# Patient Record
Sex: Female | Born: 1985 | Race: Black or African American | Hispanic: No | Marital: Single | Smoking: Current every day smoker
Health system: Southern US, Community
[De-identification: ages and names within clinical notes are randomized; demographics above are authoritative.]

## PROBLEM LIST (undated history)

## (undated) ENCOUNTER — Inpatient Hospital Stay (HOSPITAL_COMMUNITY): Payer: Self-pay

## (undated) DIAGNOSIS — Z046 Encounter for general psychiatric examination, requested by authority: Secondary | ICD-10-CM

## (undated) DIAGNOSIS — F209 Schizophrenia, unspecified: Secondary | ICD-10-CM

## (undated) DIAGNOSIS — Z789 Other specified health status: Secondary | ICD-10-CM

## (undated) DIAGNOSIS — Z8619 Personal history of other infectious and parasitic diseases: Secondary | ICD-10-CM

## (undated) HISTORY — DX: Encounter for general psychiatric examination, requested by authority: Z04.6

## (undated) HISTORY — DX: Personal history of other infectious and parasitic diseases: Z86.19

## (undated) HISTORY — PX: OTHER SURGICAL HISTORY: SHX169

---

## 2010-12-11 ENCOUNTER — Emergency Department: Payer: Self-pay | Admitting: Emergency Medicine

## 2010-12-16 ENCOUNTER — Encounter (HOSPITAL_COMMUNITY): Payer: Self-pay

## 2010-12-16 ENCOUNTER — Inpatient Hospital Stay (HOSPITAL_COMMUNITY)
Admission: AD | Admit: 2010-12-16 | Discharge: 2010-12-16 | Disposition: A | Discharge status: Home / Self Care | Payer: Medicaid Other | Source: Ambulatory Visit | Attending: Family Medicine | Admitting: Family Medicine

## 2010-12-16 DIAGNOSIS — A499 Bacterial infection, unspecified: Secondary | ICD-10-CM

## 2010-12-16 DIAGNOSIS — N760 Acute vaginitis: Secondary | ICD-10-CM | POA: Insufficient documentation

## 2010-12-16 DIAGNOSIS — B9689 Other specified bacterial agents as the cause of diseases classified elsewhere: Secondary | ICD-10-CM

## 2010-12-16 DIAGNOSIS — O2390 Unspecified genitourinary tract infection in pregnancy, unspecified trimester: Secondary | ICD-10-CM | POA: Insufficient documentation

## 2010-12-16 DIAGNOSIS — Z3201 Encounter for pregnancy test, result positive: Secondary | ICD-10-CM

## 2010-12-16 HISTORY — DX: Other specified health status: Z78.9

## 2010-12-16 LAB — URINALYSIS, ROUTINE W REFLEX MICROSCOPIC
Bilirubin Urine: NEGATIVE
Glucose, UA: NEGATIVE mg/dL
Hgb urine dipstick: NEGATIVE
Ketones, ur: NEGATIVE mg/dL
Leukocytes, UA: NEGATIVE
Nitrite: NEGATIVE
Protein, ur: NEGATIVE mg/dL
Specific Gravity, Urine: 1.025 (ref 1.005–1.030)
Urobilinogen, UA: 0.2 mg/dL (ref 0.0–1.0)
pH: 6 (ref 5.0–8.0)

## 2010-12-16 LAB — POCT PREGNANCY, URINE: Preg Test, Ur: POSITIVE

## 2010-12-16 LAB — WET PREP, GENITAL
Trich, Wet Prep: NONE SEEN
Yeast Wet Prep HPF POC: NONE SEEN

## 2010-12-16 MED ORDER — METRONIDAZOLE 500 MG PO TABS: 500.0000 mg | ORAL_TABLET | Freq: Two times a day (BID) | ORAL | Status: AC

## 2010-12-16 NOTE — Progress Notes (Signed)
Pt states LMP-10/28/2010, home upt+, has intermittent lower abd cramping, mild. Denies bleeding or vag d/c changes.
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
## 2010-12-16 NOTE — Progress Notes (Signed)
Patient states she has had positive pregnancy tests at home at the end of November. Has had some nausea, no vomiting and has cramping off and on but none now. Wants to know how far she is.

## 2010-12-17 LAB — GC/CHLAMYDIA PROBE AMP, GENITAL
Chlamydia, DNA Probe: NEGATIVE
GC Probe Amp, Genital: NEGATIVE

## 2010-12-18 ENCOUNTER — Emergency Department: Payer: Self-pay | Admitting: Internal Medicine

## 2011-01-20 ENCOUNTER — Inpatient Hospital Stay (HOSPITAL_COMMUNITY)
Admission: AD | Admit: 2011-01-20 | Discharge: 2011-01-20 | Disposition: A | Discharge status: Home / Self Care | Payer: Medicaid Other | Source: Ambulatory Visit | Attending: Obstetrics & Gynecology | Admitting: Obstetrics & Gynecology

## 2011-01-20 ENCOUNTER — Encounter (HOSPITAL_COMMUNITY): Payer: Self-pay | Admitting: *Deleted

## 2011-01-20 DIAGNOSIS — O219 Vomiting of pregnancy, unspecified: Secondary | ICD-10-CM

## 2011-01-20 DIAGNOSIS — O210 Mild hyperemesis gravidarum: Secondary | ICD-10-CM | POA: Insufficient documentation

## 2011-01-20 LAB — URINALYSIS, ROUTINE W REFLEX MICROSCOPIC
Bilirubin Urine: NEGATIVE
Glucose, UA: NEGATIVE mg/dL
Hgb urine dipstick: NEGATIVE
Ketones, ur: NEGATIVE mg/dL
Leukocytes, UA: NEGATIVE
Nitrite: NEGATIVE
Protein, ur: NEGATIVE mg/dL
Specific Gravity, Urine: 1.025 (ref 1.005–1.030)
Urobilinogen, UA: 0.2 mg/dL (ref 0.0–1.0)
pH: 6 (ref 5.0–8.0)

## 2011-01-20 MED ORDER — METOCLOPRAMIDE HCL 10 MG PO TABS: 10.0000 mg | ORAL_TABLET | Freq: Four times a day (QID) | ORAL | Status: AC

## 2011-01-20 NOTE — ED Provider Notes (Signed)
History     No chief complaint on file.  HPI 26 y.o. G1P0000 at [redacted]w[redacted]d. N/V/D since Sunday, vomiting stopped Monday, diarrhea decreased as well, still nauseous.    Past Medical History  Diagnosis Date  . No pertinent past medical history     Past Surgical History  Procedure Date  . Stitches to r eye     Family History  Problem Relation Age of Onset  . Anesthesia problems Neg Hx   . Hypotension Neg Hx   . Malignant hyperthermia Neg Hx   . Pseudochol deficiency Neg Hx     History  Substance Use Topics  . Smoking status: Current Everyday Smoker -- 0.2 packs/day    Types: Cigarettes  . Smokeless tobacco: Never Used  . Alcohol Use: No    Allergies: No Known Allergies  Prescriptions prior to admission  Medication Sig Dispense Refill  . acetaminophen (TYLENOL) 500 MG tablet Take 1,000 mg by mouth every 6 (six) hours as needed. Patient used this medication for a headache.         Review of Systems  Constitutional: Negative.   Respiratory: Negative.   Cardiovascular: Negative.   Gastrointestinal: Positive for nausea, vomiting and diarrhea. Negative for abdominal pain and constipation.  Genitourinary: Negative for dysuria, urgency, frequency, hematuria and flank pain.       Negative for vaginal bleeding, vaginal discharge, dyspareunia  Musculoskeletal: Negative.   Neurological: Negative.   Psychiatric/Behavioral: Negative.    Physical Exam   Blood pressure 100/55, pulse 74, temperature 99.2 F (37.3 C), temperature source Oral, resp. rate 20, height 5\' 1"  (1.549 m), weight 147 lb 4 oz (66.792 kg), last menstrual period 10/26/2010.  Physical Exam  Nursing note and vitals reviewed. Constitutional: She is oriented to person, place, and time. She appears well-developed and well-nourished. No distress.  Cardiovascular: Normal rate.   Respiratory: Effort normal.  GI: Soft. Bowel sounds are normal. She exhibits no distension and no mass. There is no tenderness. There is  no rebound and no guarding.  Musculoskeletal: Normal range of motion.  Neurological: She is alert and oriented to person, place, and time.  Skin: Skin is warm and dry.  Psychiatric: She has a normal mood and affect.    MAU Course  Procedures  Results for orders placed during the hospital encounter of 01/20/11 (from the past 24 hour(s))  URINALYSIS, ROUTINE W REFLEX MICROSCOPIC     Status: Normal   Collection Time   01/20/11  7:48 PM      Component Value Range   Color, Urine YELLOW  YELLOW    APPearance CLEAR  CLEAR    Specific Gravity, Urine 1.025  1.005 - 1.030    pH 6.0  5.0 - 8.0    Glucose, UA NEGATIVE  NEGATIVE (mg/dL)   Hgb urine dipstick NEGATIVE  NEGATIVE    Bilirubin Urine NEGATIVE  NEGATIVE    Ketones, ur NEGATIVE  NEGATIVE (mg/dL)   Protein, ur NEGATIVE  NEGATIVE (mg/dL)   Urobilinogen, UA 0.2  0.0 - 1.0 (mg/dL)   Nitrite NEGATIVE  NEGATIVE    Leukocytes, UA NEGATIVE  NEGATIVE      Assessment and Plan  26 y.o. G1P0000 at [redacted]w[redacted]d Pregnancy nausea vs. Gastroenteritis - rx reglan for nausea F/U PRN Start prenatal care ASAP  Stephanie Golden 01/20/2011, 8:40 PM

## 2011-01-20 NOTE — Progress Notes (Signed)
PT SAYS SHE WAS  HERE IN DEC-  GAVE EDC  08-03-2011- BASED ON CYCLE.  VOMITED STARTED Sunday NIGHT -  CONTINUED-  THRU  Monday- NONE  TODAY.   SAYS DIARRHEA STARTED Sunday NIGHT - - HAS CONTINUED  UNTIL NOW- WHEN SHE EATS- SHE GOES TO B-ROOM.-  RUNNY.   HAS BEEN  3X TODAY.      HAS NOT TAKEN  ANY MED. FOR ANYTHING.   NO COUGH,  SORETHROAT,  NO FEVER AT [facility],  NOONE ELSE SICK AT HOME.   SHE THINKS IT STARTED AFTER TAKING AN ADULT PNV INSTEAD OF   [facility] BRAND .

## 2011-01-24 NOTE — ED Provider Notes (Signed)
Agree with above note.  Stephanie Bozard H. 01/24/2011 5:55 PM

## 2011-02-22 DIAGNOSIS — O0930 Supervision of pregnancy with insufficient antenatal care, unspecified trimester: Secondary | ICD-10-CM | POA: Insufficient documentation

## 2011-02-22 DIAGNOSIS — Z8619 Personal history of other infectious and parasitic diseases: Secondary | ICD-10-CM

## 2011-02-22 HISTORY — DX: Personal history of other infectious and parasitic diseases: Z86.19

## 2011-03-01 LAB — OB RESULTS CONSOLE HEPATITIS B SURFACE ANTIGEN: Hepatitis B Surface Ag: NEGATIVE

## 2011-03-01 LAB — OB RESULTS CONSOLE HIV ANTIBODY (ROUTINE TESTING): HIV: NONREACTIVE

## 2011-03-22 ENCOUNTER — Other Ambulatory Visit (INDEPENDENT_AMBULATORY_CARE_PROVIDER_SITE_OTHER): Payer: Medicaid Other

## 2011-03-22 ENCOUNTER — Encounter (INDEPENDENT_AMBULATORY_CARE_PROVIDER_SITE_OTHER): Payer: Medicaid Other | Admitting: Registered Nurse

## 2011-03-22 DIAGNOSIS — Z3689 Encounter for other specified antenatal screening: Secondary | ICD-10-CM

## 2011-03-22 DIAGNOSIS — Z331 Pregnant state, incidental: Secondary | ICD-10-CM

## 2011-04-12 ENCOUNTER — Other Ambulatory Visit (INDEPENDENT_AMBULATORY_CARE_PROVIDER_SITE_OTHER): Payer: Medicaid Other

## 2011-04-12 ENCOUNTER — Encounter (INDEPENDENT_AMBULATORY_CARE_PROVIDER_SITE_OTHER): Payer: Medicaid Other | Admitting: Obstetrics and Gynecology

## 2011-04-12 DIAGNOSIS — O358XX0 Maternal care for other (suspected) fetal abnormality and damage, not applicable or unspecified: Secondary | ICD-10-CM

## 2011-04-12 DIAGNOSIS — Z331 Pregnant state, incidental: Secondary | ICD-10-CM

## 2011-05-04 ENCOUNTER — Telehealth: Payer: Self-pay | Admitting: Obstetrics and Gynecology

## 2011-05-04 NOTE — Telephone Encounter (Signed)
Spoke with pt rgd msg pt wants to know what she can take for her allergies and sinus advised pt can take plain sudafed, plain zyrtec, plain clara tin pt voice understanding.

## 2011-05-09 DIAGNOSIS — O0930 Supervision of pregnancy with insufficient antenatal care, unspecified trimester: Secondary | ICD-10-CM

## 2011-05-10 ENCOUNTER — Encounter: Payer: Self-pay | Admitting: Obstetrics and Gynecology

## 2011-05-10 ENCOUNTER — Ambulatory Visit (INDEPENDENT_AMBULATORY_CARE_PROVIDER_SITE_OTHER): Payer: Medicaid Other | Admitting: Obstetrics and Gynecology

## 2011-05-10 ENCOUNTER — Other Ambulatory Visit: Payer: Medicaid Other

## 2011-05-10 ENCOUNTER — Encounter: Payer: Medicaid Other | Admitting: Obstetrics and Gynecology

## 2011-05-10 VITALS — BP 90/62 | Wt 170.0 lb

## 2011-05-10 DIAGNOSIS — O350XX0 Maternal care for (suspected) central nervous system malformation in fetus, not applicable or unspecified: Secondary | ICD-10-CM

## 2011-05-10 DIAGNOSIS — Z3400 Encounter for supervision of normal first pregnancy, unspecified trimester: Secondary | ICD-10-CM

## 2011-05-10 DIAGNOSIS — Z331 Pregnant state, incidental: Secondary | ICD-10-CM

## 2011-05-10 DIAGNOSIS — O3500X0 Maternal care for (suspected) central nervous system malformation or damage in fetus, unspecified, not applicable or unspecified: Secondary | ICD-10-CM

## 2011-05-10 DIAGNOSIS — IMO0002 Reserved for concepts with insufficient information to code with codable children: Secondary | ICD-10-CM | POA: Insufficient documentation

## 2011-05-10 NOTE — Progress Notes (Signed)
No complaints Glucola today FKCs RTO 2wks

## 2011-05-11 LAB — CBC
HCT: 37 % (ref 36.0–46.0)
Hemoglobin: 12.7 g/dL (ref 12.0–15.0)
MCH: 30.7 pg (ref 26.0–34.0)
MCHC: 34.3 g/dL (ref 30.0–36.0)
MCV: 89.4 fL (ref 78.0–100.0)
Platelets: 133 10*3/uL — ABNORMAL LOW (ref 150–400)
RBC: 4.14 MIL/uL (ref 3.87–5.11)
RDW: 13.5 % (ref 11.5–15.5)
WBC: 9.3 10*3/uL (ref 4.0–10.5)

## 2011-05-11 LAB — GLUCOSE TOLERANCE, 1 HOUR: Glucose, 1 Hour GTT: 117 mg/dL (ref 70–140)

## 2011-05-11 LAB — RPR

## 2011-05-24 ENCOUNTER — Ambulatory Visit (INDEPENDENT_AMBULATORY_CARE_PROVIDER_SITE_OTHER): Payer: Medicaid Other | Admitting: Obstetrics and Gynecology

## 2011-05-24 ENCOUNTER — Encounter: Payer: Self-pay | Admitting: Obstetrics and Gynecology

## 2011-05-24 VITALS — BP 92/60 | Ht 61.0 in | Wt 174.0 lb

## 2011-05-24 DIAGNOSIS — O0930 Supervision of pregnancy with insufficient antenatal care, unspecified trimester: Secondary | ICD-10-CM

## 2011-05-24 LAB — OB RESULTS CONSOLE ANTIBODY SCREEN: Antibody Screen: NEGATIVE

## 2011-05-24 LAB — OB RESULTS CONSOLE ABO/RH: RH Type: POSITIVE

## 2011-05-24 NOTE — Progress Notes (Signed)
Reviewed normal Glucola results BT O pos

## 2011-06-08 ENCOUNTER — Encounter: Payer: Self-pay | Admitting: Obstetrics and Gynecology

## 2011-06-08 ENCOUNTER — Ambulatory Visit (INDEPENDENT_AMBULATORY_CARE_PROVIDER_SITE_OTHER): Payer: Medicaid Other | Admitting: Obstetrics and Gynecology

## 2011-06-08 VITALS — BP 100/64 | Wt 175.0 lb

## 2011-06-08 DIAGNOSIS — Z331 Pregnant state, incidental: Secondary | ICD-10-CM

## 2011-06-08 NOTE — Progress Notes (Signed)
Pt c/o swelling in feet and cramping.

## 2011-06-08 NOTE — Progress Notes (Signed)
Patient complains of bilateral groin pain.  Exam nontender.  She declines cervix check.  Return to office in 2 weeks.  Trace edema in lower extremities.  No cords or masses. Childbirth classes discussed.  Breast feeding and infant CPR discussed.

## 2011-06-22 ENCOUNTER — Ambulatory Visit (INDEPENDENT_AMBULATORY_CARE_PROVIDER_SITE_OTHER): Payer: Medicaid Other | Admitting: Obstetrics and Gynecology

## 2011-06-22 VITALS — BP 110/64 | Wt 180.0 lb

## 2011-06-22 DIAGNOSIS — Z331 Pregnant state, incidental: Secondary | ICD-10-CM

## 2011-06-22 DIAGNOSIS — Z3490 Encounter for supervision of normal pregnancy, unspecified, unspecified trimester: Secondary | ICD-10-CM

## 2011-06-22 NOTE — Progress Notes (Signed)
No complaints GBS at [facility] Doing [facility] FKCs and Labor Precautions RTO 2 wks

## 2011-06-27 ENCOUNTER — Telehealth: Payer: Self-pay | Admitting: Obstetrics and Gynecology

## 2011-06-27 NOTE — Telephone Encounter (Signed)
Bonnie/ob/epic

## 2011-06-27 NOTE — Telephone Encounter (Signed)
Spoke with shelly midwife on call. Shelly advised me to tell pt if she is bleeding heavy than the spotting and if baby is not moving than [facility]. Advised pt that she can have brown discharge for the next day. Memory Argue pt for an earlier appt pt stated she will keep her app on 07/06/11. Pt's voice understanding. BT CMA

## 2011-06-27 NOTE — Telephone Encounter (Signed)
Spoke with pt rgd incoming call from pt today. Pt stated that she is 34w 5d  spotting , pt stated that she recently had intercourse over the weekend.+ edema ,+ baby movement 0 contractions.advised pt I would consult with the midwife on call and would call pt back . Pt's voice understanding . BT CMA

## 2011-06-28 ENCOUNTER — Telehealth: Payer: Self-pay | Admitting: Obstetrics and Gynecology

## 2011-06-28 NOTE — Telephone Encounter (Signed)
TC from pt.  States is continuing to have bright red bleeding with wiping.  Decreased fetal movement today.  No cont.  But vag pressure.   Per MK pt to MAU.   Message left for SL.

## 2011-06-29 ENCOUNTER — Inpatient Hospital Stay (HOSPITAL_COMMUNITY)
Admission: AD | Admit: 2011-06-29 | Discharge: 2011-06-29 | Disposition: A | Discharge status: Home / Self Care | Payer: Medicaid Other | Source: Ambulatory Visit | Attending: Obstetrics and Gynecology | Admitting: Obstetrics and Gynecology

## 2011-06-29 ENCOUNTER — Encounter (HOSPITAL_COMMUNITY): Payer: Self-pay

## 2011-06-29 DIAGNOSIS — M545 Low back pain, unspecified: Secondary | ICD-10-CM | POA: Insufficient documentation

## 2011-06-29 DIAGNOSIS — M259 Joint disorder, unspecified: Secondary | ICD-10-CM

## 2011-06-29 DIAGNOSIS — O368190 Decreased fetal movements, unspecified trimester, not applicable or unspecified: Secondary | ICD-10-CM | POA: Insufficient documentation

## 2011-06-29 DIAGNOSIS — IMO0002 Reserved for concepts with insufficient information to code with codable children: Secondary | ICD-10-CM

## 2011-06-29 DIAGNOSIS — O9989 Other specified diseases and conditions complicating pregnancy, childbirth and the puerperium: Secondary | ICD-10-CM

## 2011-06-29 DIAGNOSIS — R109 Unspecified abdominal pain: Secondary | ICD-10-CM | POA: Insufficient documentation

## 2011-06-29 DIAGNOSIS — O4693 Antepartum hemorrhage, unspecified, third trimester: Secondary | ICD-10-CM

## 2011-06-29 DIAGNOSIS — O4690 Antepartum hemorrhage, unspecified, unspecified trimester: Secondary | ICD-10-CM

## 2011-06-29 DIAGNOSIS — M899 Disorder of bone, unspecified: Secondary | ICD-10-CM

## 2011-06-29 DIAGNOSIS — O99891 Other specified diseases and conditions complicating pregnancy: Secondary | ICD-10-CM | POA: Insufficient documentation

## 2011-06-29 LAB — URINALYSIS, ROUTINE W REFLEX MICROSCOPIC
Bilirubin Urine: NEGATIVE
Glucose, UA: NEGATIVE mg/dL
Hgb urine dipstick: NEGATIVE
Ketones, ur: NEGATIVE mg/dL
Leukocytes, UA: NEGATIVE
Nitrite: NEGATIVE
Protein, ur: NEGATIVE mg/dL
Specific Gravity, Urine: 1.015 (ref 1.005–1.030)
Urobilinogen, UA: 0.2 mg/dL (ref 0.0–1.0)
pH: 6.5 (ref 5.0–8.0)

## 2011-06-29 LAB — WET PREP, GENITAL
Clue Cells Wet Prep HPF POC: NONE SEEN
Trich, Wet Prep: NONE SEEN
Yeast Wet Prep HPF POC: NONE SEEN

## 2011-06-29 NOTE — Discharge Instructions (Signed)
Fetal Movement Counts Patient Name: __________________________________________________ Patient Due Date: ____________________ Kick counts is highly recommended in high risk pregnancies, but it is a good idea for every pregnant woman to do. Start counting fetal movements at 28 weeks of the pregnancy. Fetal movements increase after eating a full meal or eating or drinking something sweet (the blood sugar is higher). It is also important to drink plenty of fluids (well hydrated) before doing the count. Lie on your left side because it helps with the circulation or you can sit in a comfortable chair with your arms over your belly (abdomen) with no distractions around you. DOING THE COUNT  Try to do the count the same time of day each time you do it.   Mark the day and time, then see how long it takes for you to feel 10 movements (kicks, flutters, swishes, rolls). You should have at least 10 movements within 2 hours. You will most likely feel 10 movements in much less than 2 hours. If you do not, wait an hour and count again. After a couple of days you will see a pattern.   What you are looking for is a change in the pattern or not enough counts in 2 hours. Is it taking longer in time to reach 10 movements?  SEEK MEDICAL CARE IF:  You feel less than 10 counts in 2 hours. Tried twice.   No movement in one hour.   The pattern is changing or taking longer each day to reach 10 counts in 2 hours.   You feel the baby is not moving as it usually does.  Date: ____________ Movements: ____________ Start time: ____________ Finish time: ____________  Date: ____________ Movements: ____________ Start time: ____________ Finish time: ____________ Date: ____________ Movements: ____________ Start time: ____________ Finish time: ____________ Date: ____________ Movements: ____________ Start time: ____________ Finish time: ____________ Date: ____________ Movements: ____________ Start time: ____________ Finish time:  ____________ Date: ____________ Movements: ____________ Start time: ____________ Finish time: ____________ Date: ____________ Movements: ____________ Start time: ____________ Finish time: ____________ Date: ____________ Movements: ____________ Start time: ____________ Finish time: ____________  Date: ____________ Movements: ____________ Start time: ____________ Finish time: ____________ Date: ____________ Movements: ____________ Start time: ____________ Finish time: ____________ Date: ____________ Movements: ____________ Start time: ____________ Finish time: ____________ Date: ____________ Movements: ____________ Start time: ____________ Finish time: ____________ Date: ____________ Movements: ____________ Start time: ____________ Finish time: ____________ Date: ____________ Movements: ____________ Start time: ____________ Finish time: ____________ Date: ____________ Movements: ____________ Start time: ____________ Finish time: ____________  Date: ____________ Movements: ____________ Start time: ____________ Finish time: ____________ Date: ____________ Movements: ____________ Start time: ____________ Finish time: ____________ Date: ____________ Movements: ____________ Start time: ____________ Finish time: ____________ Date: ____________ Movements: ____________ Start time: ____________ Finish time: ____________ Date: ____________ Movements: ____________ Start time: ____________ Finish time: ____________ Date: ____________ Movements: ____________ Start time: ____________ Finish time: ____________ Date: ____________ Movements: ____________ Start time: ____________ Finish time: ____________  Date: ____________ Movements: ____________ Start time: ____________ Finish time: ____________ Date: ____________ Movements: ____________ Start time: ____________ Finish time: ____________ Date: ____________ Movements: ____________ Start time: ____________ Finish time: ____________ Date: ____________ Movements:  ____________ Start time: ____________ Finish time: ____________ Date: ____________ Movements: ____________ Start time: ____________ Finish time: ____________ Date: ____________ Movements: ____________ Start time: ____________ Finish time: ____________ Date: ____________ Movements: ____________ Start time: ____________ Finish time: ____________  Date: ____________ Movements: ____________ Start time: ____________ Finish time: ____________ Date: ____________ Movements: ____________ Start time: ____________ Finish time: ____________ Date: ____________ Movements: ____________ Start time:   ____________ Finish time: ____________ Date: ____________ Movements: ____________ Start time: ____________ Finish time: ____________ Date: ____________ Movements: ____________ Start time: ____________ Finish time: ____________ Date: ____________ Movements: ____________ Start time: ____________ Finish time: ____________ Date: ____________ Movements: ____________ Start time: ____________ Finish time: ____________  Date: ____________ Movements: ____________ Start time: ____________ Finish time: ____________ Date: ____________ Movements: ____________ Start time: ____________ Finish time: ____________ Date: ____________ Movements: ____________ Start time: ____________ Finish time: ____________ Date: ____________ Movements: ____________ Start time: ____________ Finish time: ____________ Date: ____________ Movements: ____________ Start time: ____________ Finish time: ____________ Date: ____________ Movements: ____________ Start time: ____________ Finish time: ____________ Date: ____________ Movements: ____________ Start time: ____________ Finish time: ____________  Date: ____________ Movements: ____________ Start time: ____________ Finish time: ____________ Date: ____________ Movements: ____________ Start time: ____________ Finish time: ____________ Date: ____________ Movements: ____________ Start time: ____________ Finish  time: ____________ Date: ____________ Movements: ____________ Start time: ____________ Finish time: ____________ Date: ____________ Movements: ____________ Start time: ____________ Finish time: ____________ Date: ____________ Movements: ____________ Start time: ____________ Finish time: ____________ Date: ____________ Movements: ____________ Start time: ____________ Finish time: ____________  Date: ____________ Movements: ____________ Start time: ____________ Finish time: ____________ Date: ____________ Movements: ____________ Start time: ____________ Finish time: ____________ Date: ____________ Movements: ____________ Start time: ____________ Finish time: ____________ Date: ____________ Movements: ____________ Start time: ____________ Finish time: ____________ Date: ____________ Movements: ____________ Start time: ____________ Finish time: ____________ Date: ____________ Movements: ____________ Start time: ____________ Finish time: ____________ Document Released: 01/26/2006 Document Revised: 12/16/2010 Document Reviewed: 07/29/2008 [employer] Patient Information 2012 [employer]. 

## 2011-06-29 NOTE — MAU Note (Signed)
Patient is in with c/o lower back pain and abdominal cramping since Monday, decreased fetal movement since yesterday. She states that she some spotting on Monday and Tuesday, the office told her to come to MAU but she couldn't come till today. She denies lof.

## 2011-06-29 NOTE — MAU Note (Signed)
Intercourse over the weekend saw some blood when wiping , back pain, pressures 35 weeks

## 2011-06-30 LAB — GC/CHLAMYDIA PROBE AMP, GENITAL
Chlamydia, DNA Probe: NEGATIVE
GC Probe Amp, Genital: NEGATIVE

## 2011-07-06 ENCOUNTER — Ambulatory Visit (INDEPENDENT_AMBULATORY_CARE_PROVIDER_SITE_OTHER): Payer: Medicaid Other | Admitting: Obstetrics and Gynecology

## 2011-07-06 ENCOUNTER — Encounter: Payer: Self-pay | Admitting: Obstetrics and Gynecology

## 2011-07-06 VITALS — BP 100/70 | Wt 184.0 lb

## 2011-07-06 DIAGNOSIS — Z331 Pregnant state, incidental: Secondary | ICD-10-CM

## 2011-07-06 DIAGNOSIS — N9089 Other specified noninflammatory disorders of vulva and perineum: Secondary | ICD-10-CM

## 2011-07-06 LAB — OB RESULTS CONSOLE GBS: GBS: NEGATIVE

## 2011-07-06 NOTE — Progress Notes (Signed)
GC, chlamydia, and beta strep sent. Blisterlike lesions on the left inner thigh.  Check herpes 1 and herpes 2. Return office in one week Dr. Stefano Gaul

## 2011-07-06 NOTE — Progress Notes (Signed)
Last week Mon. And Tues. Spotting episode.

## 2011-07-07 ENCOUNTER — Emergency Department (HOSPITAL_COMMUNITY)
Admission: EM | Admit: 2011-07-07 | Discharge: 2011-07-07 | Disposition: A | Discharge status: Home / Self Care | Payer: Medicaid Other | Source: Home / Self Care | Attending: Emergency Medicine | Admitting: Emergency Medicine

## 2011-07-07 ENCOUNTER — Encounter (HOSPITAL_COMMUNITY): Payer: Self-pay

## 2011-07-07 DIAGNOSIS — L03019 Cellulitis of unspecified finger: Secondary | ICD-10-CM

## 2011-07-07 DIAGNOSIS — IMO0001 Reserved for inherently not codable concepts without codable children: Secondary | ICD-10-CM

## 2011-07-07 LAB — HSV 2 ANTIBODY, IGG: HSV 2 Glycoprotein G Ab, IgG: <0.1 IV

## 2011-07-07 LAB — HSV 1 ANTIBODY, IGG: HSV 1 Glycoprotein G Ab, IgG: <0.1 IV

## 2011-07-07 LAB — GC/CHLAMYDIA PROBE AMP, GENITAL
Chlamydia, DNA Probe: NEGATIVE
GC Probe Amp, Genital: NEGATIVE

## 2011-07-07 MED ORDER — BACITRACIN 500 UNIT/GM EX OINT
1.0000 "application " | TOPICAL_OINTMENT | Freq: Once | CUTANEOUS | Status: AC
  Administered 2011-07-07: 1 via TOPICAL

## 2011-07-07 MED ORDER — ACETAMINOPHEN 500 MG PO TABS
1000.0000 mg | ORAL_TABLET | Freq: Once | ORAL | Status: AC
  Administered 2011-07-07: 975 mg via ORAL

## 2011-07-07 MED ORDER — LIDOCAINE HCL (PF) 2 % IJ SOLN
5.0000 mL | Freq: Once | INTRAMUSCULAR | Status: AC
  Administered 2011-07-07: 5 mL

## 2011-07-07 MED ORDER — ACETAMINOPHEN 325 MG PO TABS
ORAL_TABLET | ORAL | Status: AC
  Filled 2011-07-07: qty 3

## 2011-07-07 MED ORDER — CEPHALEXIN 500 MG PO CAPS: 500.0000 mg | ORAL_CAPSULE | Freq: Four times a day (QID) | ORAL | Status: AC

## 2011-07-07 NOTE — ED Notes (Signed)
Pt is [redacted] weeks pregnant.  States she had hangnail to rt ring finger, pulled it off.  States since Tuesday has had redness, swelling, and pain to rt ring finger- concerned for infection.
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
## 2011-07-07 NOTE — Discharge Instructions (Signed)
You may take 1 g of Tylenol up to 4 times a day as needed for pain. Return if you do not see significant improvement after being on antibiotics for 48 hours. Return sooner if you've a fever above 100.4, pain not controlled the Tylenol, or other concerns. Give [place] a working phone number so they can call you if you need to change her antibiotics.

## 2011-07-09 LAB — CULTURE, BETA STREP (GROUP B ONLY)

## 2011-07-10 LAB — CULTURE, ROUTINE-ABSCESS: Gram Stain: NONE SEEN

## 2011-07-13 ENCOUNTER — Encounter: Payer: Self-pay | Admitting: Obstetrics and Gynecology

## 2011-07-13 ENCOUNTER — Ambulatory Visit (INDEPENDENT_AMBULATORY_CARE_PROVIDER_SITE_OTHER): Payer: Medicaid Other | Admitting: Obstetrics and Gynecology

## 2011-07-13 VITALS — BP 116/68 | Wt 184.0 lb

## 2011-07-13 DIAGNOSIS — Z331 Pregnant state, incidental: Secondary | ICD-10-CM

## 2011-07-13 NOTE — ED Notes (Signed)
Abscess culture R finger: Abundant Staph. Aureus.  Pt. Treated with Keflex- not on sensitivity.  Lab shown to Dr. Chaney Malling and she said treatment is adequate. Vassie Moselle 07/13/2011

## 2011-07-13 NOTE — Progress Notes (Signed)
GC, Chlamydia, and beta strep, herpes 1, and herpes to all negative. Doing well. Return office in 1 week. Dr. Stefano Gaul

## 2011-07-13 NOTE — Progress Notes (Signed)
No complaints

## 2011-07-18 ENCOUNTER — Telehealth (HOSPITAL_COMMUNITY): Payer: Self-pay | Admitting: *Deleted

## 2011-07-18 NOTE — ED Notes (Signed)
Pt. called and said she was not given antibiotics " because she was pregnant." I asked how the abscess was doing.  She said it is still draining, bruised and tender but improved.  She did not know if she had a culture done.  I accessed her chart.  Pt. verified x 2 and given results.  I told her her chart shows she got a Rx. of Keflex. She denied getting a Rx.  Discussed with Dr. Chaney Malling and she said pt. was supposed to have taken the Keflex and it is safe during pregnancy. She said to call in the Rx.  I called pt. and left a message to call. Vassie Moselle 07/18/2011

## 2011-07-18 NOTE — ED Notes (Signed)
Pt. Called back and told that Dr. Chaney Malling wants her to take the Keflex. I asked what pharmacy did she want me to call it.  She said the [facility] on [address] I told her that the RN documented she gave you the Rx. and reviewed it with her.  I said we usually staple the prescription to the D/C instructions. I asked her if she still had them. She did. I asked her to look for a purple paper. She said she had it. I told her that is the Rx. I instructed her to take it to her pharmacy, get it filled and take it as directed.

## 2011-07-20 ENCOUNTER — Ambulatory Visit (INDEPENDENT_AMBULATORY_CARE_PROVIDER_SITE_OTHER): Payer: Medicaid Other | Admitting: Obstetrics and Gynecology

## 2011-07-20 VITALS — BP 110/76 | Wt 184.0 lb

## 2011-07-20 DIAGNOSIS — Z331 Pregnant state, incidental: Secondary | ICD-10-CM

## 2011-07-20 NOTE — Progress Notes (Signed)
Pt. Stated no issues today . Pt wants her cervix checked today . 

## 2011-07-20 NOTE — Progress Notes (Signed)
Well and no complaints

## 2011-07-21 ENCOUNTER — Telehealth: Payer: Self-pay

## 2011-07-21 ENCOUNTER — Telehealth: Payer: Self-pay | Admitting: Obstetrics and Gynecology

## 2011-07-21 NOTE — Telephone Encounter (Signed)
Spoke to pt after doing the pad test. She states that there was nothing on the pad. Per SL who happened to be in the office, pt should continue to observe throughout the evening and let us know if she does have anymore watery d/c. If she does, she will call and talk to the midwife on call. Pt voices understanding. Melody Comas A

## 2011-07-21 NOTE — Telephone Encounter (Signed)
Spoke to pt who states that she called Midwife on call last night because she thought her water had broken. She was told to monitor to see if anything else happened and then call the office today. Pt states she has had a small trickle x 1 today, a couple mins after using the restroom. She reports good FM, No ctx and no bleeding. She has not yet lost her mucous plug. She has slight crampy feeling and some low back pain. I recommended that she do the pad test and report back to me in 1 hour. Depending on results of the pad test, she may need eval either here or at MAU. I gave her my direct extension. Pt voices understanding. Melody Comas A

## 2011-07-27 ENCOUNTER — Encounter: Payer: Self-pay | Admitting: Obstetrics and Gynecology

## 2011-07-27 ENCOUNTER — Ambulatory Visit (INDEPENDENT_AMBULATORY_CARE_PROVIDER_SITE_OTHER): Payer: Medicaid Other | Admitting: Obstetrics and Gynecology

## 2011-07-27 VITALS — BP 110/72 | Temp 98.5°F | Ht 60.0 in | Wt 186.5 lb

## 2011-07-27 DIAGNOSIS — Z331 Pregnant state, incidental: Secondary | ICD-10-CM

## 2011-07-27 DIAGNOSIS — J029 Acute pharyngitis, unspecified: Secondary | ICD-10-CM

## 2011-07-27 DIAGNOSIS — Z3490 Encounter for supervision of normal pregnancy, unspecified, unspecified trimester: Secondary | ICD-10-CM

## 2011-07-27 LAB — RAPID STREP SCREEN (MED CTR MEBANE ONLY): Streptococcus, Group A Screen (Direct): NEGATIVE

## 2011-07-27 NOTE — Progress Notes (Signed)
Pt c/o sore throat since 07/25/11. C/o head and nasal congestion. C/o productive cough with yellow phlegm. Pt took "some type of children's medicine" with mild improvement. Pt desires cx check today.  Pharynx:  No erythema   Rapid strept culture done

## 2011-08-02 ENCOUNTER — Ambulatory Visit (INDEPENDENT_AMBULATORY_CARE_PROVIDER_SITE_OTHER): Payer: Medicaid Other | Admitting: Obstetrics and Gynecology

## 2011-08-02 VITALS — BP 120/88 | Wt 192.0 lb

## 2011-08-02 DIAGNOSIS — Z331 Pregnant state, incidental: Secondary | ICD-10-CM

## 2011-08-02 DIAGNOSIS — Z3490 Encounter for supervision of normal pregnancy, unspecified, unspecified trimester: Secondary | ICD-10-CM

## 2011-08-02 NOTE — Progress Notes (Signed)
Ready!  cx check GROUP B neg 07/06/11 Strep Culture Neg 07/27/11

## 2011-08-02 NOTE — Progress Notes (Signed)
[redacted]w[redacted]d No change in vaginal secretions. C/o pressure in rectal area and vaginal area. BH ctx at intervals -  Feet swollen - +1 - advised to keep feet elevated and can soak in water with Epson Salts to assist with swelling. SVE: closed and posterior. ROB x 1 week

## 2011-08-06 ENCOUNTER — Inpatient Hospital Stay (HOSPITAL_COMMUNITY)
Admission: AD | Admit: 2011-08-06 | Discharge: 2011-08-06 | Disposition: A | Discharge status: Home / Self Care | Payer: Medicaid Other | Source: Ambulatory Visit | Attending: Obstetrics and Gynecology | Admitting: Obstetrics and Gynecology

## 2011-08-06 ENCOUNTER — Encounter (HOSPITAL_COMMUNITY): Payer: Self-pay | Admitting: Obstetrics and Gynecology

## 2011-08-06 DIAGNOSIS — O471 False labor at or after 37 completed weeks of gestation: Secondary | ICD-10-CM

## 2011-08-06 DIAGNOSIS — O479 False labor, unspecified: Secondary | ICD-10-CM | POA: Insufficient documentation

## 2011-08-06 DIAGNOSIS — IMO0002 Reserved for concepts with insufficient information to code with codable children: Secondary | ICD-10-CM

## 2011-08-06 MED ORDER — HYDROCODONE-ACETAMINOPHEN 5-325 MG PO TABS
2.0000 | ORAL_TABLET | Freq: Once | ORAL | Status: AC
  Administered 2011-08-06: 2 via ORAL
  Filled 2011-08-06: qty 2

## 2011-08-06 MED ORDER — ZOLPIDEM TARTRATE 5 MG PO TABS
5.0000 mg | ORAL_TABLET | Freq: Once | ORAL | Status: AC
  Administered 2011-08-06: 5 mg via ORAL
  Filled 2011-08-06: qty 1

## 2011-08-06 NOTE — MAU Provider Note (Signed)
History   CSN: [id number]  Arrival date and time: 08/06/11 4401   First Provider Initiated Contact with Patient 08/06/11 [phone]      Chief Complaint  Patient presents with  . Contractions  . Labor Eval   HPI Pt presents at 40w 3d gestation with c/o UCs which began around 0500 7/27 and increased intensity and frequency since onset.  Denies ROM or bldg.  Active fetus.    OB History    Grav Para Term Preterm Abortions TAB SAB Ect Mult Living   1 0 0 0 0 0 0 0 0 0       Past Medical History  Diagnosis Date  . No pertinent past medical history   . H/O candidiasis 02/22/11    yeast  . H/O rubella 02/22/11    yeast  . H/O varicella     Past Surgical History  Procedure Date  . Stitches to r eye     Family History  Problem Relation Age of Onset  . Anesthesia problems Neg Hx   . Hypotension Neg Hx   . Malignant hyperthermia Neg Hx   . Pseudochol deficiency Neg Hx   . Drug abuse Father   . Diabetes Maternal Uncle   . Hypertension Maternal Uncle   . Cancer Paternal Aunt   . Cancer Maternal Grandmother   . Heart disease Paternal Grandmother   . Hypertension Paternal Grandmother   . Diabetes Paternal Grandmother   . Hypertension Maternal Grandfather     History  Substance Use Topics  . Smoking status: Former Smoker -- 0.2 packs/day    Types: Cigarettes  . Smokeless tobacco: Never Used  . Alcohol Use: No    Allergies: No Known Allergies  Prescriptions prior to admission  Medication Sig Dispense Refill  . acetaminophen (TYLENOL) 325 MG tablet Take 650 mg by mouth every 6 (six) hours as needed. Back pain      . Biotin 5000 MCG CAPS Take 5,000 mcg by mouth.      . cephALEXin (KEFLEX) 500 MG capsule Take 500 mg by mouth 4 (four) times daily.      . Prenatal Vit-Fe Fumarate-FA (PRENATAL MULTIVITAMIN) TABS Take 1 tablet by mouth daily.        Review of Systems  Constitutional: Negative.   HENT: Negative.   Eyes: Negative.   Respiratory: Negative.   Cardiovascular:  Negative.   Gastrointestinal: Negative.   Genitourinary: Negative.   Musculoskeletal: Negative.   Skin: Negative.   Neurological: Negative.   Endo/Heme/Allergies: Negative.   Psychiatric/Behavioral: Negative.    Physical Exam   Blood pressure 125/69, pulse 66, temperature 98.7 F (37.1 C), resp. rate 18, last menstrual period 10/27/2010.  Physical Exam  Constitutional: She is oriented to person, place, and time. She appears well-developed and well-nourished.  HENT:  Head: Normocephalic and atraumatic.  Right Ear: External ear normal.  Left Ear: External ear normal.  Eyes: Conjunctivae are normal. Pupils are equal, round, and reactive to light.  Neck: Normal range of motion. Neck supple. No thyromegaly present.  Cardiovascular: Normal rate, regular rhythm and intact distal pulses.   Respiratory: Effort normal and breath sounds normal.  GI: Soft. Bowel sounds are normal.       Gravid, FH 39cm  Genitourinary: Vagina normal and uterus normal.       Ext genitalia WNL.  BUS neg.  Musculoskeletal: Normal range of motion.  Neurological: She is alert and oriented to person, place, and time. She has normal reflexes.  Skin: Skin is warm  and dry.  Psychiatric: She has a normal mood and affect. Her behavior is normal.   SVE: 1cm dilation/10% effaced/-3/vtx/midposition/soft.  FHR:  Baseline 140 bpm Variability: moderate Accels: Present Decels: Absent UCs every 2-73mins FHR Cat 1  MAU Course  Procedures   Assessment and Plan  IUP at 40w 3d Early vs False labor  Ambulate for 1 hr then reeval SVE. Dr. Stefano Gaul consulted.  Detron Carras O. 08/06/2011, 9:13 AM   S:  Pt reports UCs decreased in frequency and intensity since resting in bed but did continue while walking but no significant increase in intensity.  No ROM or bldg.    O:  FHR baseline 150bpm       Moderate variability       Accels present       Decels absent       UCs every 6-8 minutes         SVE: 1cm dilation        Effacement: 50%       Station: -3       VTX  A:  IUP at 40w 3d      False labor  P:  Will discharge to home after RBA d/w pt and give Vicodin and Ambien for therapeutic rest.       Reviewed s/s labor.       RTO Tuesday, 08/09/11 as previous scheduled for ROB at [facility].   Doniel Maiello O. 08/06/2011, 11:51 AM

## 2011-08-07 ENCOUNTER — Inpatient Hospital Stay (HOSPITAL_COMMUNITY): Payer: Medicaid Other | Admitting: Anesthesiology

## 2011-08-07 ENCOUNTER — Encounter (HOSPITAL_COMMUNITY): Payer: Self-pay | Admitting: Anesthesiology

## 2011-08-07 ENCOUNTER — Encounter (HOSPITAL_COMMUNITY)
Admission: AD | Disposition: A | Discharge status: Home / Self Care | Payer: Self-pay | Source: Ambulatory Visit | Attending: Obstetrics and Gynecology

## 2011-08-07 ENCOUNTER — Inpatient Hospital Stay (HOSPITAL_COMMUNITY)
Admission: AD | Admit: 2011-08-07 | Discharge: 2011-08-10 | DRG: 765 | Disposition: A | Discharge status: Home / Self Care | Payer: Medicaid Other | Source: Ambulatory Visit | Attending: Obstetrics and Gynecology | Admitting: Obstetrics and Gynecology

## 2011-08-07 ENCOUNTER — Encounter (HOSPITAL_COMMUNITY): Payer: Self-pay

## 2011-08-07 DIAGNOSIS — D4959 Neoplasm of unspecified behavior of other genitourinary organ: Secondary | ICD-10-CM | POA: Diagnosis present

## 2011-08-07 DIAGNOSIS — Z98891 History of uterine scar from previous surgery: Secondary | ICD-10-CM | POA: Diagnosis not present

## 2011-08-07 DIAGNOSIS — D259 Leiomyoma of uterus, unspecified: Secondary | ICD-10-CM | POA: Diagnosis present

## 2011-08-07 DIAGNOSIS — O9912 Other diseases of the blood and blood-forming organs and certain disorders involving the immune mechanism complicating childbirth: Secondary | ICD-10-CM | POA: Diagnosis present

## 2011-08-07 DIAGNOSIS — IMO0002 Reserved for concepts with insufficient information to code with codable children: Secondary | ICD-10-CM

## 2011-08-07 DIAGNOSIS — D696 Thrombocytopenia, unspecified: Secondary | ICD-10-CM | POA: Diagnosis present

## 2011-08-07 DIAGNOSIS — D689 Coagulation defect, unspecified: Secondary | ICD-10-CM | POA: Diagnosis present

## 2011-08-07 DIAGNOSIS — O34599 Maternal care for other abnormalities of gravid uterus, unspecified trimester: Secondary | ICD-10-CM | POA: Diagnosis present

## 2011-08-07 LAB — CBC
HCT: 38.4 % (ref 36.0–46.0)
HCT: 32.3 % — ABNORMAL LOW (ref 36.0–46.0)
Hemoglobin: 12.9 g/dL (ref 12.0–15.0)
Hemoglobin: 11.1 g/dL — ABNORMAL LOW (ref 12.0–15.0)
MCH: 28.7 pg (ref 26.0–34.0)
MCH: 29.5 pg (ref 26.0–34.0)
MCHC: 33.6 g/dL (ref 30.0–36.0)
MCHC: 34.4 g/dL (ref 30.0–36.0)
MCV: 85.3 fL (ref 78.0–100.0)
MCV: 85.9 fL (ref 78.0–100.0)
Platelets: 90 10*3/uL — ABNORMAL LOW (ref 150–400)
Platelets: 75 10*3/uL — ABNORMAL LOW (ref 150–400)
RBC: 4.5 MIL/uL (ref 3.87–5.11)
RBC: 3.76 MIL/uL — ABNORMAL LOW (ref 3.87–5.11)
RDW: 13.7 % (ref 11.5–15.5)
RDW: 13.7 % (ref 11.5–15.5)
WBC: 9.8 10*3/uL (ref 4.0–10.5)
WBC: 15.5 10*3/uL — ABNORMAL HIGH (ref 4.0–10.5)

## 2011-08-07 LAB — URINE MICROSCOPIC-ADD ON

## 2011-08-07 LAB — URINALYSIS, ROUTINE W REFLEX MICROSCOPIC
Bilirubin Urine: NEGATIVE
Glucose, UA: NEGATIVE mg/dL
Ketones, ur: NEGATIVE mg/dL
Leukocytes, UA: NEGATIVE
Nitrite: NEGATIVE
Protein, ur: NEGATIVE mg/dL
Specific Gravity, Urine: <1.005 — ABNORMAL LOW (ref 1.005–1.030)
Urobilinogen, UA: 0.2 mg/dL (ref 0.0–1.0)
pH: 6 (ref 5.0–8.0)

## 2011-08-07 LAB — RPR: RPR Ser Ql: NONREACTIVE

## 2011-08-07 LAB — COMPREHENSIVE METABOLIC PANEL
ALT: 10 U/L (ref 0–35)
AST: 17 U/L (ref 0–37)
Albumin: 2.7 g/dL — ABNORMAL LOW (ref 3.5–5.2)
Alkaline Phosphatase: 146 U/L — ABNORMAL HIGH (ref 39–117)
BUN: 7 mg/dL (ref 6–23)
CO2: 20 mEq/L (ref 19–32)
Calcium: 9.3 mg/dL (ref 8.4–10.5)
Chloride: 106 mEq/L (ref 96–112)
Creatinine, Ser: 0.58 mg/dL (ref 0.50–1.10)
GFR calc Af Amer: >90 mL/min (ref >90)
GFR calc non Af Amer: >90 mL/min (ref >90)
Glucose, Bld: 80 mg/dL (ref 70–99)
Potassium: 3.7 mEq/L (ref 3.5–5.1)
Sodium: 138 mEq/L (ref 135–145)
Total Bilirubin: 0.2 mg/dL — ABNORMAL LOW (ref 0.3–1.2)
Total Protein: 5.9 g/dL — ABNORMAL LOW (ref 6.0–8.3)

## 2011-08-07 LAB — RAPID URINE DRUG SCREEN, HOSP PERFORMED
Amphetamines: NOT DETECTED
Barbiturates: NOT DETECTED
Benzodiazepines: NOT DETECTED
Cocaine: NOT DETECTED
Opiates: NOT DETECTED
Tetrahydrocannabinol: NOT DETECTED

## 2011-08-07 LAB — URIC ACID: Uric Acid, Serum: 5.7 mg/dL (ref 2.4–7.0)

## 2011-08-07 LAB — LACTATE DEHYDROGENASE: LDH: 199 U/L (ref 94–250)

## 2011-08-07 SURGERY — Surgical Case
Anesthesia: Regional | Site: Abdomen | Wound class: Clean Contaminated

## 2011-08-07 MED ORDER — DIPHENHYDRAMINE HCL 25 MG PO CAPS
25.0000 mg | ORAL_CAPSULE | ORAL | Status: DC | PRN
  Administered 2011-08-08: 25 mg via ORAL
  Filled 2011-08-07: qty 1

## 2011-08-07 MED ORDER — LIDOCAINE HCL (PF) 1 % IJ SOLN: 30.0000 mL | INTRAMUSCULAR | Status: DC | PRN

## 2011-08-07 MED ORDER — IBUPROFEN 600 MG PO TABS: 600.0000 mg | ORAL_TABLET | Freq: Four times a day (QID) | ORAL | Status: DC | PRN

## 2011-08-07 MED ORDER — LACTATED RINGERS IV SOLN: 500.0000 mL | Freq: Once | INTRAVENOUS | Status: DC

## 2011-08-07 MED ORDER — LACTATED RINGERS IV SOLN
INTRAVENOUS | Status: DC
  Administered 2011-08-07 – 2011-08-08 (×2): via INTRAVENOUS

## 2011-08-07 MED ORDER — ONDANSETRON HCL 4 MG PO TABS: 4.0000 mg | ORAL_TABLET | ORAL | Status: DC | PRN

## 2011-08-07 MED ORDER — DIBUCAINE 1 % RE OINT: 1.0000 "application " | TOPICAL_OINTMENT | RECTAL | Status: DC | PRN

## 2011-08-07 MED ORDER — FENTANYL CITRATE 0.05 MG/ML IJ SOLN
INTRAMUSCULAR | Status: DC | PRN
  Administered 2011-08-07 (×2): 50 ug via INTRAVENOUS

## 2011-08-07 MED ORDER — OXYTOCIN 10 UNIT/ML IJ SOLN
40.0000 [IU] | INTRAVENOUS | Status: DC | PRN
  Administered 2011-08-07: 40 [IU] via INTRAVENOUS

## 2011-08-07 MED ORDER — CEFAZOLIN SODIUM-DEXTROSE 2-3 GM-% IV SOLR
2.0000 g | Freq: Once | INTRAVENOUS | Status: DC
  Filled 2011-08-07: qty 50

## 2011-08-07 MED ORDER — OXYTOCIN BOLUS FROM INFUSION
250.0000 mL | Freq: Once | INTRAVENOUS | Status: DC
  Filled 2011-08-07: qty 500

## 2011-08-07 MED ORDER — SIMETHICONE 80 MG PO CHEW
80.0000 mg | CHEWABLE_TABLET | Freq: Three times a day (TID) | ORAL | Status: DC
  Administered 2011-08-07 – 2011-08-10 (×10): 80 mg via ORAL

## 2011-08-07 MED ORDER — EPHEDRINE 5 MG/ML INJ: 10.0000 mg | INTRAVENOUS | Status: DC | PRN

## 2011-08-07 MED ORDER — CEFAZOLIN SODIUM-DEXTROSE 2-3 GM-% IV SOLR
INTRAVENOUS | Status: AC
  Filled 2011-08-07: qty 50

## 2011-08-07 MED ORDER — LIDOCAINE-EPINEPHRINE (PF) 2 %-1:200000 IJ SOLN
INTRAMUSCULAR | Status: AC
  Filled 2011-08-07: qty 20

## 2011-08-07 MED ORDER — FENTANYL CITRATE 0.05 MG/ML IJ SOLN
INTRAMUSCULAR | Status: AC
  Filled 2011-08-07: qty 2

## 2011-08-07 MED ORDER — KETOROLAC TROMETHAMINE 30 MG/ML IJ SOLN: 30.0000 mg | Freq: Four times a day (QID) | INTRAMUSCULAR | Status: DC | PRN

## 2011-08-07 MED ORDER — MEASLES, MUMPS & RUBELLA VAC ~~LOC~~ INJ
0.5000 mL | INJECTION | Freq: Once | SUBCUTANEOUS | Status: DC
  Filled 2011-08-07: qty 0.5

## 2011-08-07 MED ORDER — FENTANYL 2.5 MCG/ML BUPIVACAINE 1/10 % EPIDURAL INFUSION (WH - ANES)
INTRAMUSCULAR | Status: DC | PRN
  Administered 2011-08-07: 14 mL/h via EPIDURAL

## 2011-08-07 MED ORDER — FLEET ENEMA 7-19 GM/118ML RE ENEM: 1.0000 | ENEMA | RECTAL | Status: DC | PRN

## 2011-08-07 MED ORDER — SODIUM CHLORIDE 0.9 % IR SOLN
Status: DC | PRN
  Administered 2011-08-07: 1000 mL

## 2011-08-07 MED ORDER — DIPHENHYDRAMINE HCL 50 MG/ML IJ SOLN: 12.5000 mg | INTRAMUSCULAR | Status: DC | PRN

## 2011-08-07 MED ORDER — PHENYLEPHRINE 40 MCG/ML (10ML) SYRINGE FOR IV PUSH (FOR BLOOD PRESSURE SUPPORT)
80.0000 ug | PREFILLED_SYRINGE | INTRAVENOUS | Status: DC | PRN
  Filled 2011-08-07: qty 5

## 2011-08-07 MED ORDER — OXYCODONE-ACETAMINOPHEN 5-325 MG PO TABS
1.0000 | ORAL_TABLET | ORAL | Status: DC | PRN
  Administered 2011-08-08: 1 via ORAL
  Administered 2011-08-08: 2 via ORAL
  Administered 2011-08-08 – 2011-08-09 (×4): 1 via ORAL
  Administered 2011-08-09: 2 via ORAL
  Administered 2011-08-09: 1 via ORAL
  Administered 2011-08-09 – 2011-08-10 (×4): 2 via ORAL
  Administered 2011-08-10 (×2): 1 via ORAL
  Filled 2011-08-07: qty 2
  Filled 2011-08-07: qty 1
  Filled 2011-08-07: qty 2
  Filled 2011-08-07 (×2): qty 1
  Filled 2011-08-07 (×2): qty 2
  Filled 2011-08-07: qty 1
  Filled 2011-08-07 (×2): qty 2
  Filled 2011-08-07 (×4): qty 1

## 2011-08-07 MED ORDER — WITCH HAZEL-GLYCERIN EX PADS: 1.0000 "application " | MEDICATED_PAD | CUTANEOUS | Status: DC | PRN

## 2011-08-07 MED ORDER — METOCLOPRAMIDE HCL 5 MG/ML IJ SOLN: 10.0000 mg | Freq: Three times a day (TID) | INTRAMUSCULAR | Status: DC | PRN

## 2011-08-07 MED ORDER — MEPERIDINE HCL 25 MG/ML IJ SOLN
6.2500 mg | INTRAMUSCULAR | Status: DC | PRN
  Administered 2011-08-07: 6.25 mg via INTRAVENOUS

## 2011-08-07 MED ORDER — BUTORPHANOL TARTRATE 1 MG/ML IJ SOLN: 1.0000 mg | INTRAMUSCULAR | Status: DC | PRN

## 2011-08-07 MED ORDER — TERBUTALINE SULFATE 1 MG/ML IJ SOLN
0.2500 mg | Freq: Once | INTRAMUSCULAR | Status: AC
  Administered 2011-08-07: 0.25 mg via SUBCUTANEOUS
  Filled 2011-08-07: qty 1

## 2011-08-07 MED ORDER — SIMETHICONE 80 MG PO CHEW: 80.0000 mg | CHEWABLE_TABLET | ORAL | Status: DC | PRN

## 2011-08-07 MED ORDER — LACTATED RINGERS IV SOLN: 500.0000 mL | INTRAVENOUS | Status: DC | PRN

## 2011-08-07 MED ORDER — TERBUTALINE SULFATE 1 MG/ML IJ SOLN
INTRAMUSCULAR | Status: AC
  Filled 2011-08-07: qty 1

## 2011-08-07 MED ORDER — MENTHOL 3 MG MT LOZG: 1.0000 | LOZENGE | OROMUCOSAL | Status: DC | PRN

## 2011-08-07 MED ORDER — NALBUPHINE SYRINGE 5 MG/0.5 ML
5.0000 mg | INJECTION | INTRAMUSCULAR | Status: DC | PRN
Start: 2011-08-07 — End: 2011-08-10
  Filled 2011-08-07: qty 1

## 2011-08-07 MED ORDER — MEPERIDINE HCL 25 MG/ML IJ SOLN: 6.2500 mg | INTRAMUSCULAR | Status: DC | PRN

## 2011-08-07 MED ORDER — ZOLPIDEM TARTRATE 5 MG PO TABS: 5.0000 mg | ORAL_TABLET | Freq: Every evening | ORAL | Status: DC | PRN

## 2011-08-07 MED ORDER — KETOROLAC TROMETHAMINE 60 MG/2ML IM SOLN: 60.0000 mg | Freq: Once | INTRAMUSCULAR | Status: DC | PRN

## 2011-08-07 MED ORDER — MEDROXYPROGESTERONE ACETATE 150 MG/ML IM SUSP: 150.0000 mg | INTRAMUSCULAR | Status: DC | PRN

## 2011-08-07 MED ORDER — FENTANYL 2.5 MCG/ML BUPIVACAINE 1/10 % EPIDURAL INFUSION (WH - ANES)
14.0000 mL/h | INTRAMUSCULAR | Status: DC
  Administered 2011-08-07: 14 mL/h via EPIDURAL
  Filled 2011-08-07 (×2): qty 60

## 2011-08-07 MED ORDER — MORPHINE SULFATE (PF) 0.5 MG/ML IJ SOLN
INTRAMUSCULAR | Status: DC | PRN
  Administered 2011-08-07: 1 mg via INTRAVENOUS

## 2011-08-07 MED ORDER — BUPIVACAINE-EPINEPHRINE 0.5% -1:200000 IJ SOLN
INTRAMUSCULAR | Status: DC | PRN
  Administered 2011-08-07: 10 mL

## 2011-08-07 MED ORDER — PRENATAL MULTIVITAMIN CH
1.0000 | ORAL_TABLET | Freq: Every day | ORAL | Status: DC
  Administered 2011-08-08 – 2011-08-10 (×3): 1 via ORAL
  Filled 2011-08-07 (×3): qty 1

## 2011-08-07 MED ORDER — ONDANSETRON HCL 4 MG/2ML IJ SOLN: 4.0000 mg | INTRAMUSCULAR | Status: DC | PRN

## 2011-08-07 MED ORDER — LACTATED RINGERS IV SOLN
INTRAVENOUS | Status: DC
  Administered 2011-08-07: 06:00:00 via INTRAVENOUS

## 2011-08-07 MED ORDER — KETOROLAC TROMETHAMINE 30 MG/ML IJ SOLN: 15.0000 mg | Freq: Once | INTRAMUSCULAR | Status: DC | PRN

## 2011-08-07 MED ORDER — SODIUM CHLORIDE 0.9 % IJ SOLN: 3.0000 mL | INTRAMUSCULAR | Status: DC | PRN

## 2011-08-07 MED ORDER — ONDANSETRON HCL 4 MG/2ML IJ SOLN: 4.0000 mg | Freq: Three times a day (TID) | INTRAMUSCULAR | Status: DC | PRN

## 2011-08-07 MED ORDER — OXYTOCIN 40 UNITS IN LACTATED RINGERS INFUSION - SIMPLE MED
1.0000 m[IU]/min | INTRAVENOUS | Status: DC
  Administered 2011-08-07: 1 m[IU]/min via INTRAVENOUS
  Filled 2011-08-07: qty 1000

## 2011-08-07 MED ORDER — SODIUM BICARBONATE 8.4 % IV SOLN
INTRAVENOUS | Status: AC
  Filled 2011-08-07: qty 50

## 2011-08-07 MED ORDER — LACTATED RINGERS IV SOLN
INTRAVENOUS | Status: DC
  Administered 2011-08-07: 08:00:00 via INTRAUTERINE

## 2011-08-07 MED ORDER — TETANUS-DIPHTH-ACELL PERTUSSIS 5-2.5-18.5 LF-MCG/0.5 IM SUSP
0.5000 mL | Freq: Once | INTRAMUSCULAR | Status: AC
  Administered 2011-08-08: 0.5 mL via INTRAMUSCULAR
  Filled 2011-08-07: qty 0.5

## 2011-08-07 MED ORDER — OXYTOCIN 10 UNIT/ML IJ SOLN
INTRAMUSCULAR | Status: AC
  Filled 2011-08-07: qty 4

## 2011-08-07 MED ORDER — SENNOSIDES-DOCUSATE SODIUM 8.6-50 MG PO TABS
2.0000 | ORAL_TABLET | Freq: Every day | ORAL | Status: DC
  Administered 2011-08-07 – 2011-08-09 (×3): 2 via ORAL

## 2011-08-07 MED ORDER — MORPHINE SULFATE (PF) 0.5 MG/ML IJ SOLN
INTRAMUSCULAR | Status: DC | PRN
  Administered 2011-08-07: 4 mg via EPIDURAL

## 2011-08-07 MED ORDER — DIPHENHYDRAMINE HCL 25 MG PO CAPS
25.0000 mg | ORAL_CAPSULE | Freq: Four times a day (QID) | ORAL | Status: DC | PRN
  Filled 2011-08-07: qty 1

## 2011-08-07 MED ORDER — OXYTOCIN 40 UNITS IN LACTATED RINGERS INFUSION - SIMPLE MED: 62.5000 mL/h | Freq: Once | INTRAVENOUS | Status: DC

## 2011-08-07 MED ORDER — NALOXONE HCL 0.4 MG/ML IJ SOLN: 0.4000 mg | INTRAMUSCULAR | Status: DC | PRN

## 2011-08-07 MED ORDER — BUPIVACAINE-EPINEPHRINE (PF) 0.5% -1:200000 IJ SOLN
INTRAMUSCULAR | Status: AC
  Filled 2011-08-07: qty 10

## 2011-08-07 MED ORDER — SCOPOLAMINE 1 MG/3DAYS TD PT72
1.0000 | MEDICATED_PATCH | Freq: Once | TRANSDERMAL | Status: AC
  Administered 2011-08-07: 1.5 mg via TRANSDERMAL

## 2011-08-07 MED ORDER — LACTATED RINGERS IV SOLN
INTRAVENOUS | Status: DC | PRN
  Administered 2011-08-07: 13:00:00 via INTRAVENOUS

## 2011-08-07 MED ORDER — LANOLIN HYDROUS EX OINT: 1.0000 "application " | TOPICAL_OINTMENT | CUTANEOUS | Status: DC | PRN

## 2011-08-07 MED ORDER — ONDANSETRON HCL 4 MG/2ML IJ SOLN
INTRAMUSCULAR | Status: AC
  Filled 2011-08-07: qty 2

## 2011-08-07 MED ORDER — HYDROMORPHONE HCL PF 1 MG/ML IJ SOLN
0.2500 mg | INTRAMUSCULAR | Status: DC | PRN
  Administered 2011-08-07 (×2): 0.5 mg via INTRAVENOUS

## 2011-08-07 MED ORDER — MORPHINE SULFATE 0.5 MG/ML IJ SOLN
INTRAMUSCULAR | Status: AC
  Filled 2011-08-07: qty 10

## 2011-08-07 MED ORDER — SODIUM BICARBONATE 8.4 % IV SOLN
INTRAVENOUS | Status: DC | PRN
  Administered 2011-08-07: 5 mL via EPIDURAL

## 2011-08-07 MED ORDER — CEFAZOLIN SODIUM 1-5 GM-% IV SOLN
INTRAVENOUS | Status: DC | PRN
  Administered 2011-08-07: 2 g via INTRAVENOUS

## 2011-08-07 MED ORDER — CITRIC ACID-SODIUM CITRATE 334-500 MG/5ML PO SOLN
30.0000 mL | ORAL | Status: DC | PRN
  Administered 2011-08-07: 30 mL via ORAL
  Filled 2011-08-07: qty 15

## 2011-08-07 MED ORDER — OXYTOCIN 40 UNITS IN LACTATED RINGERS INFUSION - SIMPLE MED: 62.5000 mL/h | INTRAVENOUS | Status: AC

## 2011-08-07 MED ORDER — ACETAMINOPHEN 325 MG PO TABS: 650.0000 mg | ORAL_TABLET | ORAL | Status: DC | PRN

## 2011-08-07 MED ORDER — LIDOCAINE HCL (PF) 1 % IJ SOLN
INTRAMUSCULAR | Status: DC | PRN
  Administered 2011-08-07 (×2): 8 mL

## 2011-08-07 MED ORDER — PHENYLEPHRINE 40 MCG/ML (10ML) SYRINGE FOR IV PUSH (FOR BLOOD PRESSURE SUPPORT): 80.0000 ug | PREFILLED_SYRINGE | INTRAVENOUS | Status: DC | PRN

## 2011-08-07 MED ORDER — IBUPROFEN 600 MG PO TABS
600.0000 mg | ORAL_TABLET | Freq: Four times a day (QID) | ORAL | Status: DC
  Administered 2011-08-08 – 2011-08-09 (×4): 600 mg via ORAL
  Filled 2011-08-07 (×4): qty 1

## 2011-08-07 MED ORDER — PROMETHAZINE HCL 25 MG/ML IJ SOLN: 6.2500 mg | INTRAMUSCULAR | Status: DC | PRN

## 2011-08-07 MED ORDER — MEPERIDINE HCL 25 MG/ML IJ SOLN
INTRAMUSCULAR | Status: AC
  Filled 2011-08-07: qty 1

## 2011-08-07 MED ORDER — ONDANSETRON HCL 4 MG/2ML IJ SOLN: 4.0000 mg | Freq: Four times a day (QID) | INTRAMUSCULAR | Status: DC | PRN

## 2011-08-07 MED ORDER — ONDANSETRON HCL 4 MG/2ML IJ SOLN
INTRAMUSCULAR | Status: DC | PRN
  Administered 2011-08-07: 4 mg via INTRAVENOUS

## 2011-08-07 MED ORDER — NALBUPHINE SYRINGE 5 MG/0.5 ML
5.0000 mg | INJECTION | INTRAMUSCULAR | Status: DC | PRN
  Filled 2011-08-07: qty 1

## 2011-08-07 MED ORDER — EPHEDRINE 5 MG/ML INJ
10.0000 mg | INTRAVENOUS | Status: DC | PRN
  Filled 2011-08-07: qty 4

## 2011-08-07 MED ORDER — HYDROMORPHONE HCL PF 1 MG/ML IJ SOLN
INTRAMUSCULAR | Status: AC
  Filled 2011-08-07: qty 1

## 2011-08-07 MED ORDER — LACTATED RINGERS IV SOLN
Freq: Once | INTRAVENOUS | Status: AC
  Administered 2011-08-07: 05:00:00 via INTRAVENOUS

## 2011-08-07 MED ORDER — OXYCODONE-ACETAMINOPHEN 5-325 MG PO TABS: 1.0000 | ORAL_TABLET | ORAL | Status: DC | PRN

## 2011-08-07 MED ORDER — TERBUTALINE SULFATE 1 MG/ML IJ SOLN: 0.2500 mg | Freq: Once | INTRAMUSCULAR | Status: DC | PRN

## 2011-08-07 MED ORDER — SODIUM CHLORIDE 0.9 % IV SOLN
1.0000 ug/kg/h | INTRAVENOUS | Status: DC | PRN
  Filled 2011-08-07: qty 2.5

## 2011-08-07 MED ORDER — DIPHENHYDRAMINE HCL 50 MG/ML IJ SOLN: 25.0000 mg | INTRAMUSCULAR | Status: DC | PRN

## 2011-08-07 MED ORDER — SCOPOLAMINE 1 MG/3DAYS TD PT72
MEDICATED_PATCH | TRANSDERMAL | Status: AC
  Administered 2011-08-07: 1.5 mg via TRANSDERMAL
  Filled 2011-08-07: qty 1

## 2011-08-07 SURGICAL SUPPLY — 39 items
CHLORAPREP W/TINT 26ML (MISCELLANEOUS) ×2 IMPLANT
CLOTH BEACON ORANGE TIMEOUT ST (SAFETY) ×2 IMPLANT
CONTAINER PREFILL 10% NBF 15ML (MISCELLANEOUS) IMPLANT
DRAIN JACKSON PRT FLT 7MM (DRAIN) IMPLANT
DRESSING TELFA 8X3 (GAUZE/BANDAGES/DRESSINGS) IMPLANT
DRSG COVADERM 4X10 (GAUZE/BANDAGES/DRESSINGS) ×2 IMPLANT
ELECT REM PT RETURN 9FT ADLT (ELECTROSURGICAL) ×2
ELECTRODE REM PT RTRN 9FT ADLT (ELECTROSURGICAL) ×1 IMPLANT
EVACUATOR SILICONE 100CC (DRAIN) IMPLANT
EXTRACTOR VACUUM M CUP 4 TUBE (SUCTIONS) IMPLANT
GAUZE SPONGE 4X4 12PLY STRL LF (GAUZE/BANDAGES/DRESSINGS) IMPLANT
GLOVE BIOGEL PI IND STRL 8.5 (GLOVE) ×1 IMPLANT
GLOVE BIOGEL PI INDICATOR 8.5 (GLOVE) ×1
GLOVE ECLIPSE 8.0 STRL XLNG CF (GLOVE) ×4 IMPLANT
GOWN PREVENTION PLUS LG XLONG (DISPOSABLE) ×4 IMPLANT
GOWN PREVENTION PLUS XXLARGE (GOWN DISPOSABLE) ×2 IMPLANT
KIT ABG SYR 3ML LUER SLIP (SYRINGE) IMPLANT
NEEDLE HYPO 25X1 1.5 SAFETY (NEEDLE) ×2 IMPLANT
NEEDLE HYPO 25X5/8 SAFETYGLIDE (NEEDLE) IMPLANT
PACK C SECTION WH (CUSTOM PROCEDURE TRAY) ×2 IMPLANT
PAD ABD 7.5X8 STRL (GAUZE/BANDAGES/DRESSINGS) ×2 IMPLANT
RINGERS IRRIG 1000ML POUR BTL (IV SOLUTION) ×2 IMPLANT
SLEEVE SCD COMPRESS KNEE MED (MISCELLANEOUS) IMPLANT
STAPLER VISISTAT 35W (STAPLE) IMPLANT
SUT MNCRL AB 3-0 PS2 27 (SUTURE) ×2 IMPLANT
SUT PLAIN 0 NONE (SUTURE) IMPLANT
SUT SILK 3 0 FS 1X18 (SUTURE) IMPLANT
SUT VIC AB 0 CT1 27 (SUTURE) ×2
SUT VIC AB 0 CT1 27XBRD ANBCTR (SUTURE) ×2 IMPLANT
SUT VIC AB 2-0 CTX 36 (SUTURE) ×4 IMPLANT
SUT VIC AB 3-0 CT1 27 (SUTURE)
SUT VIC AB 3-0 CT1 TAPERPNT 27 (SUTURE) IMPLANT
SUT VIC AB 3-0 SH 27 (SUTURE)
SUT VIC AB 3-0 SH 27X BRD (SUTURE) IMPLANT
SYR CONTROL 10ML LL (SYRINGE) ×2 IMPLANT
TAPE CLOTH SURG 4X10 WHT LF (GAUZE/BANDAGES/DRESSINGS) ×2 IMPLANT
TOWEL OR 17X24 6PK STRL BLUE (TOWEL DISPOSABLE) ×4 IMPLANT
TRAY FOLEY CATH 14FR (SET/KITS/TRAYS/PACK) IMPLANT
WATER STERILE IRR 1000ML POUR (IV SOLUTION) ×2 IMPLANT

## 2011-08-07 NOTE — Op Note (Signed)
OPERATIVE NOTE  Patient's Name: Stephanie Golden  Date of Birth: Sep 05, 1985  Medical Records Number: [id number]  Date of Operation: 08/07/2011  Preoperative diagnosis:  [redacted]w[redacted]d weeks gestation  non-reasurring fetal heart tracing  Postoperative diagnosis:  [redacted]w[redacted]d weeks gestation  non-reasurring fetal heart tracing  Fibroid uterus  Procedure:  Primary low transverse cesarean section  Surgeon:  Leonard Schwartz, M.D.  Assistant:  Sanda Klein, certified nurse midwife  Anesthesia:  Regional  Disposition:  Stephanie Golden is a 26 y.o. female, G1P0000, who presents at [redacted]w[redacted]d weeks gestation. The patient has been followed at the [facility] of [facility] health care for women. She has the above mentioned diagnosis. She understands the indications for her procedure and she accepts the risk of, but not limited to, anesthetic complications, bleeding, infections, and possible damage to the surrounding organs.  Findings:  A  female (Anari) was delivered from a LOT position.  The Apgar scores were 9/9 . The uterus, fallopian tubes, and ovaries were normal for the gravid state. A 3.5 cm fibroid is present on the anterior right lower quadrant.  Procedure:  The patient was taken to the operating room where it was determined that the epidural she had received her labor would be adequate for cesarean delivery. The patient's abdomen was prepped with Chloraprep.  A Foley catheter was placed in the bladder on labor and delivery. The patient was sterilely draped. The lower abdomen was injected with half percent Marcaine with epinephrine. A low transverse incision was made in the abdomen and carried sharply through the subcutaneous tissue, the fascia, and the anterior peritoneum. An incision was made in the lower uterine segment. The incision was extended in a low transverse fashion. The membranes were ruptured. The fetal head was delivered without  difficulty. The mouth and nose were suctioned. The remainder of the infant was then delivered. The cord was clamped and cut. The infant was handed to the awaiting pediatric team. The placenta was removed. The uterine cavity was cleaned of amniotic fluid, clotted blood, and membranes. The uterine incision was closed using a running locking suture of 2-0 Vicryl. An imbricating suture of 2-0 Vicryl was placed. The pelvis was vigorously irrigated. Hemostasis was adequate. The anterior peritoneum and the abdominal musculature were closed using 2-0 Vicryl. The fascia was closed using a running suture of 0 Vicryl followed by 3 interrupted sutures of 0 Vicryl. The subcutaneous layer was closed using interrupted sutures of 2-0 Vicryl. The skin was reapproximated using a subcuticular suture of 3-0 Monocryl. Sponge, needle, and instrument counts were correct on 2 occasions. The estimated blood loss for the procedure was 800 cc. The patient tolerated her procedure well. She was transported to the recovery room in stable condition. The infant stayed with the parents for bonding. The placenta was sent to pathology.  Leonard Schwartz, M.D.

## 2011-08-07 NOTE — Addendum Note (Signed)
Addendum  created 08/07/11 1800 by Len Blalock, CRNA   Modules edited:Notes Section

## 2011-08-07 NOTE — Anesthesia Postprocedure Evaluation (Signed)
  Anesthesia Post-op Note  Patient: Stephanie Golden  Procedure(s) Performed: Procedure(s) (LRB): CESAREAN SECTION (N/A)  Patient Location: PACU and Mother/Baby  Anesthesia Type: Epidural  Level of Consciousness: awake, alert  and oriented  Airway and Oxygen Therapy: Patient Spontanous Breathing    Post-op Assessment: Patient's Cardiovascular Status Stable and Respiratory Function Stable  Post-op Vital Signs: stable  Complications: No apparent anesthesia complications

## 2011-08-07 NOTE — Progress Notes (Signed)
Patient ID: Stephanie Golden, female   DOB: 07/22/1985, 26 y.o.   MRN: [id number] .Subjective:  Called to BS by RN secondary to FHR decels  Objective: BP 124/73  Pulse 73  Temp 98.1 F (36.7 C) (Oral)  Resp 18  Ht 5\' 1"  (1.549 m)  Wt 189 lb (85.73 kg)  BMI 35.71 kg/m2  SpO2 100%  LMP 10/27/2010   FHT:  FHR: 140 bpm, variability: minimal ,  accelerations:  Abscent,  decelerations:  Present variables, prolonged, some late onset UC:   irregular, every 1-5 minutes, run of tachysystole SVE:   Dilation: 4 Effacement (%): 100 Station: -1 Exam by:: Vance Gather  MVU's 130-190  Assessment / Plan: non-reassuring FHT's Variables have resolved after pitocin off and position changes   Fetal Wellbeing:  Category II Pain Control:  Epidural  rv'd w Dr Stefano Gaul, will recommend c/s, R/B d/w pt   Isahia Hollerbach M 08/07/2011, 12:27 PM

## 2011-08-07 NOTE — Progress Notes (Signed)
Katrinka Blazing, CNM at bedside, discussing with pt plan of care, discussing with pt potential for c-section due to FHR, discussing risks and benefits of c-section, pt verbalizes understanding

## 2011-08-07 NOTE — Transfer of Care (Signed)
Immediate Anesthesia Transfer of Care Note  Patient: Stephanie Golden  Procedure(s) Performed: Procedure(s) (LRB): CESAREAN SECTION (N/A)  Patient Location: pacu  Anesthesia Type: Epidural  Level of Consciousness: awake, alert  and oriented  Airway & Oxygen Therapy: Patient Spontanous Breathing  Post-op Assessment: Report given to PACU RN and Post -op Vital signs reviewed and stable  Post vital signs: stable  Complications: No apparent anesthesia complications

## 2011-08-07 NOTE — Progress Notes (Signed)
Patient ID: Antanisha Mohs, female   DOB: June 12, 1985, 26 y.o.   MRN: [id number] .Subjective: Comfortable w epidural   Objective: BP 126/70  Pulse 69  Temp 98.1 F (36.7 C) (Oral)  Resp 18  Ht 5\' 1"  (1.549 m)  Wt 189 lb (85.73 kg)  BMI 35.71 kg/m2  SpO2 100%  LMP 10/27/2010   FHT:  FHR: 145 bpm, variability: minimal ,  accelerations:  Present,  decelerations:  Present early and variables, occ w late onset,  periods of dec variability, +scalp stim  UC:   irregular, every 3-5 minutes SVE:   Dilation: 3 Effacement (%): 100 Station: -1 Exam by:: Vance Gather  FSE placed without difficulty, vertex OT   Assessment / Plan: early labor Inadequate ctx PIH labs WNL ITP   Will start pitocin augmentation   Fetal Wellbeing:  Category I and Category II, overall reassuring,  Pain Control:  Epidural  Update physician PRN  Malissa Hippo 08/07/2011, 9:41 AM

## 2011-08-07 NOTE — Progress Notes (Signed)
Fetal heart rate: Category 2. Patient has been given fluid bolus. O2 given. Patient positioned on her side. Cervix 2 cm, 70% effaced, -2, -3 in station. Head well applied.  AROM. Fluid clear. Intrauterine pressure catheter inserted. We'll begin amnioinfusion. The potential need for cesarean section was reviewed with the patient and her significant other. The risk and benefits were outlined. Questions were answered. Dr. Stefano Gaul

## 2011-08-07 NOTE — Progress Notes (Signed)
CS was reviewed. The risks and benefits were outlined again. The specific risks include, but are not limited to, anesthetic complications, bleeding, infections, and possible damage to the surrounding organs. The patient's questions were answered.  We are ready to proceed as outlined. The likelihood of the patient achieving the goals of this procedure is very likely.   Leonard Schwartz, M.D.

## 2011-08-07 NOTE — Anesthesia Postprocedure Evaluation (Signed)
Anesthesia Post Note  Patient: Stephanie Golden  Procedure(s) Performed: Procedure(s) (LRB): CESAREAN SECTION (N/A)  Anesthesia type: Epidural  Patient location: PACU  Post pain: Pain level controlled  Post assessment: Post-op Vital signs reviewed  Last Vitals:  Filed Vitals:   08/07/11 1445  BP: 105/60  Pulse: 73  Temp:   Resp: 16    Post vital signs: Reviewed  Level of consciousness: awake  Complications: No apparent anesthesia complications

## 2011-08-07 NOTE — Progress Notes (Signed)
Patient ID: Stephanie Golden, female   DOB: [birth year], 26 y.o.   MRN: [id number] .Subjective: Requests epidural, coping well, denies any HA/N/V/RUQ pain  Objective: BP 133/66  Pulse 80  Temp 98.1 F (36.7 C) (Axillary)  Resp 18  Ht 5\' 1"  (1.549 m)  Wt 189 lb (85.73 kg)  BMI 35.71 kg/m2  LMP 10/27/2010   FHT:  FHR: 150 bpm, variability: moderate,  accelerations:  Present,  decelerations:  Present mild variables, appear to be resolving UC:   irregular, every 5-6 minutes SVE:   Dilation: 3 Effacement (%):  (75) Station: -2 Exam by:: Dr. Stefano Gaul    Assessment / Plan: early labor FHR reassuring now  GBS neg Platelets are 90,  UA neg UDS neg Will check cmet, ldh, uric acid C/w anesthesia for epidural  Augment w pitocin if FHR remains reassuring    Fetal Wellbeing:  Category I Pain Control:  Epidural  Update physician PRN  Malissa Hippo 08/07/2011, 7:38 AM

## 2011-08-07 NOTE — Progress Notes (Signed)
Fetal heart rate: Category 1 Comfortable with epidural. Continue amnioinfusion. Observe her labor. PIH labs pending. Dr. Stefano Gaul

## 2011-08-07 NOTE — H&P (Addendum)
Stephanie Golden is a 26 y.o. female presenting for contractions with increased frequency and intensity.  Denies ROM or bldg.  Reports active fetus.  Pt was previously evaluated in MAU on 08/06/11 with contractions without cervical change.  She was given medication for therapeutic rest and discharged to home.  She reports she slept for several hours with UCs resolved, however the UCs began again a few hours ago.  FHR tracing at that time was Cat 1.    Maternal Medical History:  Reason for admission: Reason for admission: contractions.  Contractions: Onset was 3-5 hours ago.   Frequency: regular.   Perceived severity is strong.    Fetal activity: Perceived fetal activity is normal.   Last perceived fetal movement was within the past hour.    Prenatal complications: no prenatal complications Prenatal Complications - Diabetes: none.   Hx Present Preg:  Pt initiated care at [redacted]w[redacted]d.  Initial ultrasound performed at 18wks with fetal choroid plexus cyst noted.  Quad screen was negative.  Repeat US with CPCs resolved at 23wks.  Pt with neg GTT result of 117.  Pt with lesion of inner thigh at 36wks cultured for HSV with neg results.  Pt txed for paronchyia of her finger by ED at 37wks.  Neg GBS.  Essentially unremarkable prenatal course.   OB History    Grav Para Term Preterm Abortions TAB SAB Ect Mult Living   1 0 0 0 0 0 0 0 0 0      Past Medical History  Diagnosis Date  . No pertinent past medical history   . H/O candidiasis 02/22/11    yeast  . H/O rubella 02/22/11    yeast  . H/O varicella    Past Surgical History  Procedure Date  . Stitches to r eye    Family History: family history includes Cancer in her maternal grandmother and paternal aunt; Diabetes in her maternal uncle and paternal grandmother; Drug abuse in her father; Heart disease in her paternal grandmother; and Hypertension in her maternal grandfather, maternal uncle, and paternal grandmother.  There is no history of Anesthesia  problems, and Hypotension, and Malignant hyperthermia, and Pseudochol deficiency, . Social History:  reports that she has quit smoking. Her smoking use included Cigarettes. She smoked .25 packs per day. She has never used smokeless tobacco. She reports that she does not drink alcohol or use illicit drugs.   Prenatal Transfer Tool  Maternal Diabetes: No Genetic Screening: Normal Maternal Ultrasounds/Referrals: Normal Fetal Ultrasounds or other Referrals:  Other: see prenatal record Maternal Substance Abuse:  No Significant Maternal Medications:  None Significant Maternal Lab Results:  None Other Comments:  None  Review of Systems  Constitutional: Negative.   HENT: Negative.   Eyes: Negative.   Respiratory: Negative.   Cardiovascular: Negative.   Gastrointestinal: Negative.   Genitourinary: Negative.   Musculoskeletal: Negative.   Skin: Negative.   Neurological: Negative.   Endo/Heme/Allergies: Negative.   Psychiatric/Behavioral: Negative.     Dilation: 2 Effacement (%): 60 Station: -3 Exam by:: Conni Elliot, CNM Blood pressure 138/72, pulse 66, temperature 98.5 F (36.9 C), temperature source Oral, resp. rate 16, height 5\' 1"  (1.549 m), weight 86.138 kg (189 lb 14.4 oz), last menstrual period 10/27/2010. Maternal Exam:  Uterine Assessment: Contraction strength is firm.  Contraction frequency is regular.   Abdomen: Patient reports no abdominal tenderness. Fundal height is 40.   Estimated fetal weight is 8#.   Fetal presentation: vertex  Introitus: Normal vulva. Normal vagina.  Ferning test:  not done.  Nitrazine test: not done.  Pelvis: adequate for delivery.   Cervix: Cervix evaluated by digital exam.     Fetal Exam Fetal Monitor Review: Mode: ultrasound.   Baseline rate: 175.  Variability: moderate (6-25 bpm).   Pattern: late decelerations and variable decelerations.    Fetal State Assessment: Category II - tracings are indeterminate.     Physical Exam    Constitutional: She is oriented to person, place, and time. She appears well-developed and well-nourished.  HENT:  Head: Normocephalic and atraumatic.  Right Ear: External ear normal.  Left Ear: External ear normal.  Nose: Nose normal.  Eyes: EOM are normal. Pupils are equal, round, and reactive to light.  Neck: Normal range of motion. Neck supple. No thyromegaly present.  Cardiovascular: Normal rate, regular rhythm and intact distal pulses.   Respiratory: Effort normal and breath sounds normal.  GI: Soft. Bowel sounds are normal.       Gravid, nontender.  Ut relaxed between UCs.  Genitourinary: Vagina normal.       SVE 2cm/60%/-3/posterior/soft/vtx  Musculoskeletal: Normal range of motion.  Neurological: She is alert and oriented to person, place, and time. She has normal reflexes.  Skin: Skin is warm and dry.  Psychiatric: She has a normal mood and affect. Her behavior is normal.    Prenatal labs: ABO, Rh: O/Positive/-- (05/14 0000) Antibody: Negative (05/14 0000) Rubella:  Immune RPR: NON REAC (04/30 1600)  HBsAg: Negative (02/19 0000)  HIV: Non-reactive (02/19 0000)  GBS: Negative (06/26 0000)   Assessment/Plan: IUP at 40w 3d Early labor Fetal tachycardia  Admit to [employer]. Consult obtained with Dr. Stefano Gaul. IVF bolus.   Terbutaline 0.25mg  [place] given and O2 by mask due to FHR tracing. Will continue to observe FHR carefully  Stephanie Avis O. 08/07/2011, 5:00 AM

## 2011-08-07 NOTE — Progress Notes (Signed)
Stephanie Golden is a 26 y.o. G1P0000 at [redacted]w[redacted]d admitted for labor and non-reassuring FHR monitoring.  Subjective: Pt now on birthing suites.  Reports decreased pain with contractions since terbutaline.  Pt continues with IVF bolus, side lying position and 02  Objective: FHT:  Baseline 170; Variability moderate; Accels- absent; Decels-non-repetitive occas late decel, variable decel. Overall FHR improved at present.  UC:   irregular, every 5-7 minutes SVE:   Deferred   Assessment / Plan: IUP at 40w 4d Early labor Fetal tachycardia/FHR decels-improving  Dr. Stefano Gaul present on [facility] and aware of FHR status.   Will continue to observe FHR carefully.  Adesuwa Osgood O. 08/07/2011, 2:46 PM

## 2011-08-07 NOTE — MAU Note (Signed)
Patient is in for labor eval. She states that the contractions are closer and more painful. Denies lof. She reports good fetal movement

## 2011-08-07 NOTE — Anesthesia Procedure Notes (Signed)
Epidural Patient location during procedure: OB Start time: 08/07/2011 7:56 AM End time: 08/07/2011 8:01 AM Reason for block: procedure for pain  Staffing Anesthesiologist: Sandrea Hughs Performed by: anesthesiologist   Preanesthetic Checklist Completed: patient identified, site marked, surgical consent, pre-op evaluation, timeout performed, IV checked, risks and benefits discussed and monitors and equipment checked  Epidural Patient position: sitting Prep: site prepped and draped and DuraPrep Patient monitoring: continuous pulse ox and blood pressure Approach: midline Injection technique: LOR air  Needle:  Needle type: Tuohy  Needle gauge: 17 G Needle length: 9 cm Catheter type: closed end flexible Catheter size: 19 Gauge Catheter at skin depth: 11 cm Test dose: negative and Other  Assessment Sensory level: T9 Events: blood not aspirated, injection not painful, no injection resistance, negative IV test and no paresthesia

## 2011-08-07 NOTE — OR Nursing (Addendum)
Uterus massaged by S. Karolynn Infantino Charity fundraiser. Two tubes of cord blood to lab. Foley catheter in place upon arrival to OR. Urine color-slightly concentrated.  15cc of blood evacuated from uterus during uterine massage.

## 2011-08-07 NOTE — Anesthesia Preprocedure Evaluation (Signed)
Anesthesia Evaluation  Patient identified by MRN, date of birth, ID band Patient awake    Reviewed: Allergy & Precautions, H&P , NPO status , Patient's Chart, lab work & pertinent test results  Airway Mallampati: II TM Distance: >3 FB Neck ROM: full    Dental No notable dental hx.    Pulmonary neg pulmonary ROS,    Pulmonary exam normal       Cardiovascular negative cardio ROS      Neuro/Psych negative neurological ROS  negative psych ROS   GI/Hepatic negative GI ROS, Neg liver ROS,   Endo/Other    Renal/GU negative Renal ROS  negative genitourinary   Musculoskeletal negative musculoskeletal ROS (+)   Abdominal (+) + obese,   Peds  Hematology negative hematology ROS (+)   Anesthesia Other Findings   Reproductive/Obstetrics (+) Pregnancy                           Anesthesia Physical Anesthesia Plan  ASA: III  Anesthesia Plan: Epidural   Post-op Pain Management:    Induction:   Airway Management Planned:   Additional Equipment:   Intra-op Plan:   Post-operative Plan:   Informed Consent: I have reviewed the patients History and Physical, chart, labs and discussed the procedure including the risks, benefits and alternatives for the proposed anesthesia with the patient or authorized representative who has indicated his/her understanding and acceptance.     Plan Discussed with:   Anesthesia Plan Comments:         Anesthesia Quick Evaluation

## 2011-08-08 ENCOUNTER — Encounter (HOSPITAL_COMMUNITY): Payer: Self-pay | Admitting: *Deleted

## 2011-08-08 LAB — CBC
HCT: 28.7 % — ABNORMAL LOW (ref 36.0–46.0)
HCT: 28.2 % — ABNORMAL LOW (ref 36.0–46.0)
Hemoglobin: 9.8 g/dL — ABNORMAL LOW (ref 12.0–15.0)
Hemoglobin: 9.6 g/dL — ABNORMAL LOW (ref 12.0–15.0)
MCH: 29.3 pg (ref 26.0–34.0)
MCH: 29.3 pg (ref 26.0–34.0)
MCHC: 34.1 g/dL (ref 30.0–36.0)
MCHC: 34 g/dL (ref 30.0–36.0)
MCV: 85.9 fL (ref 78.0–100.0)
MCV: 86 fL (ref 78.0–100.0)
Platelets: 74 10*3/uL — ABNORMAL LOW (ref 150–400)
Platelets: 77 10*3/uL — ABNORMAL LOW (ref 150–400)
RBC: 3.34 MIL/uL — ABNORMAL LOW (ref 3.87–5.11)
RBC: 3.28 MIL/uL — ABNORMAL LOW (ref 3.87–5.11)
RDW: 13.9 % (ref 11.5–15.5)
RDW: 13.8 % (ref 11.5–15.5)
WBC: 12 10*3/uL — ABNORMAL HIGH (ref 4.0–10.5)
WBC: 12.9 10*3/uL — ABNORMAL HIGH (ref 4.0–10.5)

## 2011-08-08 NOTE — Progress Notes (Signed)
UR chart review completed.  

## 2011-08-08 NOTE — Progress Notes (Signed)
Patient ID: Stephanie Golden, female   DOB: October 13, 1985, 26 y.o.   MRN: [id number] Subjective: Postpartum Day 1: Cesarean Delivery Patient reports tolerating PO, no flatus, no BM and no problems voiding.    Objective: Vital signs in last 24 hours: Temp:  [97.7 F (36.5 C)-99.5 F (37.5 C)] 98.4 F (36.9 C) (07/29 1020) Pulse Rate:  [62-94] 67  (07/29 1020) Resp:  [16-29] 18  (07/29 1020) BP: (85-146)/(55-89) 120/68 mmHg (07/29 1020) SpO2:  [95 %-100 %] 98 % (07/29 1020)  Physical Exam:  General: alert Lochia: appropriate Uterine Fundus: firm Incision: no significant drainage.  Bandage CDI DVT Evaluation: No evidence of DVT seen on physical exam.   Basename 08/08/11 0535 08/07/11 1444  HGB 9.8* 11.1*  HCT 28.7* 32.3*    Assessment/Plan: Status post Cesarean section. Doing well postoperatively.  Continue current care. Pt needs assistance with BF.  I helped her some and will get a lactation consult  Odus Clasby A 08/08/2011, 11:35 AM

## 2011-08-09 ENCOUNTER — Encounter: Payer: Medicaid Other | Admitting: Obstetrics and Gynecology

## 2011-08-09 LAB — CBC
HCT: 29.5 % — ABNORMAL LOW (ref 36.0–46.0)
Hemoglobin: 9.9 g/dL — ABNORMAL LOW (ref 12.0–15.0)
MCH: 28.9 pg (ref 26.0–34.0)
MCHC: 33.6 g/dL (ref 30.0–36.0)
MCV: 86.3 fL (ref 78.0–100.0)
Platelets: 94 10*3/uL — ABNORMAL LOW (ref 150–400)
RBC: 3.42 MIL/uL — ABNORMAL LOW (ref 3.87–5.11)
RDW: 13.9 % (ref 11.5–15.5)
WBC: 11.1 10*3/uL — ABNORMAL HIGH (ref 4.0–10.5)

## 2011-08-09 MED ORDER — PNEUMOCOCCAL VAC POLYVALENT 25 MCG/0.5ML IJ INJ
0.5000 mL | INJECTION | INTRAMUSCULAR | Status: AC
  Administered 2011-08-10: 0.5 mL via INTRAMUSCULAR
  Filled 2011-08-09: qty 0.5

## 2011-08-09 NOTE — Progress Notes (Signed)
Subjective: Postpartum Day 2: Cesarean Delivery due to [facility]. Patient reports no problems voiding.  Up ad lib, no syncope or dizziness.  Epidural cath still in place due to thrombocytopenia. Feeding:  Breast   Contraceptive:  Unknown at present.  Objective: Vital signs in last 24 hours: Temp:  [97.9 F (36.6 C)-98.7 F (37.1 C)] 97.9 F (36.6 C) (07/30 0605) Pulse Rate:  [67-88] 88  (07/30 0605) Resp:  [18-20] 20  (07/30 0605) BP: (106-114)/(66-72) 114/72 mmHg (07/30 0605) SpO2:  [97 %] 97 % (07/29 2137)  Physical Exam:  General: alert Lochia: appropriate Uterine Fundus: firm Incision: Dressing CDI DVT Evaluation: No evidence of DVT seen on physical exam. Negative Homan's sign. Calf/Ankle edema is present, 1+. JP drain:   NA  Results for orders placed during the hospital encounter of 08/07/11 (from the past 48 hour(s))  CBC     Status: Abnormal   Collection Time   08/07/11  2:44 PM      Component Value Range Comment   WBC 15.5 (*) 4.0 - 10.5 K/uL    RBC 3.76 (*) 3.87 - 5.11 MIL/uL    Hemoglobin 11.1 (*) 12.0 - 15.0 g/dL    HCT 16.1 (*) 09.6 - 46.0 %    MCV 85.9  78.0 - 100.0 fL    MCH 29.5  26.0 - 34.0 pg    MCHC 34.4  30.0 - 36.0 g/dL    RDW 04.5  40.9 - 81.1 %    Platelets 75 (*) 150 - 400 K/uL   CBC     Status: Abnormal   Collection Time   08/08/11  5:35 AM      Component Value Range Comment   WBC 12.0 (*) 4.0 - 10.5 K/uL    RBC 3.34 (*) 3.87 - 5.11 MIL/uL    Hemoglobin 9.8 (*) 12.0 - 15.0 g/dL    HCT 91.4 (*) 78.2 - 46.0 %    MCV 85.9  78.0 - 100.0 fL    MCH 29.3  26.0 - 34.0 pg    MCHC 34.1  30.0 - 36.0 g/dL    RDW 95.6  21.3 - 08.6 %    Platelets 74 (*) 150 - 400 K/uL   CBC     Status: Abnormal   Collection Time   08/08/11  6:32 PM      Component Value Range Comment   WBC 12.9 (*) 4.0 - 10.5 K/uL    RBC 3.28 (*) 3.87 - 5.11 MIL/uL    Hemoglobin 9.6 (*) 12.0 - 15.0 g/dL    HCT 57.8 (*) 46.9 - 46.0 %    MCV 86.0  78.0 - 100.0 fL    MCH 29.3  26.0 - 34.0  pg    MCHC 34.0  30.0 - 36.0 g/dL    RDW 62.9  52.8 - 41.3 %    Platelets 77 (*) 150 - 400 K/uL      Basename 08/08/11 1832 08/08/11 0535  HGB 9.6* 9.8*  HCT 28.2* 28.7*    Assessment/Plan: Status post Cesarean section. Doing well postoperatively.  Thrombocytopenia--stable  Plan: Consulted with anesthesia--will repeat CBC now, then they will determine plan for epidural cath removal. Reviewed low platelet issue with patient. Continue current care. Anticipate d/c tomorrow.  Stephanie Golden 08/09/2011, 12:27 PM

## 2011-08-10 MED ORDER — IBUPROFEN 600 MG PO TABS: 600.0000 mg | ORAL_TABLET | Freq: Four times a day (QID) | ORAL | Status: AC | PRN

## 2011-08-10 MED ORDER — OXYCODONE-ACETAMINOPHEN 5-325 MG PO TABS: 1.0000 | ORAL_TABLET | ORAL | Status: AC | PRN

## 2011-08-10 MED ORDER — IBUPROFEN 600 MG PO TABS
600.0000 mg | ORAL_TABLET | Freq: Four times a day (QID) | ORAL | Status: DC | PRN
  Administered 2011-08-10: 600 mg via ORAL
  Filled 2011-08-10: qty 1

## 2011-08-10 NOTE — Discharge Summary (Signed)
Obstetric Discharge Summary Reason for Admission: onset of labor Prenatal Procedures: NST and ultrasound Intrapartum Procedures: cesarean: low cervical, transverse Postpartum Procedures: none Complications-Operative and Postpartum: Thrombocytopenia Hemoglobin  Date Value Range Status  08/09/2011 9.9* 12.0 - 15.0 g/dL Final     HCT  Date Value Range Status  08/09/2011 29.5* 36.0 - 46.0 % Final   Hospital Course: Admitted in labor on 08/07/11. Negative GBS.  Received epidural, and progressed to 4 cm, but had sporadic decelerations throughout labor.  These continued, and the patient was taken for LTCS by Dr. Stefano Gaul.  Platelet count was 90 on admission, with nadir to 74 on day 1 post-op.  Epidural catheter was maintained until day 2 post-op, when platelets were 94. Patient and baby tolerated the procedure without difficulty.  Infant to FTN. Mother and infant then had an uncomplicated postpartum course, with breast feeding going well.  She was having some engorgement on day 3, with [facility] consult obtained to assist her with this.  Mom's physical exam was WNL on day 3, and she was discharged home in stable condition. Contraception plan was undecided on the day of d/c.  She received adequate benefit from po pain medications--Motrin had been stopped with the nadir platelet level, but was restarted on day 3.  Patient was sent home with Rxs for Motrin and Percocet.      Physical Exam:  General: alert Lochia: appropriate Uterine Fundus: firm Incision: healing well DVT Evaluation: No evidence of DVT seen on physical exam. Negative Homan's sign.  Discharge Diagnoses: Term Pregnancy-delivered and thrombocytopenia, primary LT cesarean for non-reassuring FHR  Discharge Information: Date: 08/10/2011 Activity: Per [facility] handout Diet: routine Medications: Ibuprofen and Percocet Condition: stable Instructions: refer to practice specific booklet Discharge to: home Contraception:  Undecided at d/c,  information reviewed and given with d/c instructions. Follow-up Information    Follow up with [facility] in 6 weeks. (Call to schedule appointment)          Newborn Data: Live born female  Birth Weight: 5 lb 14.4 oz (2676 g) APGAR: 9, 9  Home with mother.  Stephanie Golden 08/10/2011, 9:22 AM

## 2011-08-23 ENCOUNTER — Telehealth: Payer: Self-pay | Admitting: Obstetrics and Gynecology

## 2011-08-23 NOTE — Telephone Encounter (Signed)
TRIAGE/EPIC/AVS PT

## 2011-08-23 NOTE — Telephone Encounter (Signed)
Tc to pt regarding msg, lm on vm to call back. 

## 2011-08-24 NOTE — Telephone Encounter (Signed)
Pt rtn'd call.  States breasts have been very engorged and she is trying to dry up her milk supply, wants to know what to do.  Pt advised to go ahead and pump what she has out, wear a tight bra 24 hrs a day, avoid shower water hitting breasts during showers, apply ice pack and put cabbage leaves in freezer to stiffen up leaves to keep on longer and minimize stimulation, apply to breasts and can try applying oil of camphor.  Signs and sxs of mastitis given to pt, pt to call if starts to get any sxs.  Pt also advised may call lactation consultant for other suggestions.  Pt voices understanding.

## 2011-09-15 ENCOUNTER — Encounter: Payer: Self-pay | Admitting: Obstetrics and Gynecology

## 2011-09-15 ENCOUNTER — Ambulatory Visit (INDEPENDENT_AMBULATORY_CARE_PROVIDER_SITE_OTHER): Payer: Medicaid Other | Admitting: Obstetrics and Gynecology

## 2011-09-15 VITALS — BP 102/64 | Ht 62.0 in | Wt 162.0 lb

## 2011-09-15 DIAGNOSIS — Z309 Encounter for contraceptive management, unspecified: Secondary | ICD-10-CM

## 2011-09-15 NOTE — Progress Notes (Signed)
Date of delivery: 08/07/2011 Female Name: Stephanie Golden Vaginal delivery:no Cesarean section:yes Tubal ligation:no GDM:no Breast Feeding:no Bottle Feeding:yes Post-Partum Blues:no Abnormal pap:no Normal GU function: no Normal GI function:no Returning to work:yes EPDS: 4  No complaints.  Wants IUD. Filed Vitals:   09/15/11 1615  BP: 102/64   ROS: noncontributory  Pelvic exam:  VULVA: normal appearing vulva with no masses, tenderness or lesions,  VAGINA: normal appearing vagina with normal color and discharge, no lesions, CERVIX: normal appearing cervix without discharge or lesions,  UTERUS: uterus is normal size, shape, consistency and nontender,  ADNEXA: normal adnexa in size, nontender and no masses.  A/P GC/CT with consent pre IUD SE of Mirena reviewed (pamphlet given) Next available for Mirena insertion

## 2011-09-16 LAB — GC/CHLAMYDIA PROBE AMP, GENITAL
Chlamydia, DNA Probe: NEGATIVE
GC Probe Amp, Genital: NEGATIVE

## 2011-09-20 ENCOUNTER — Encounter: Payer: Self-pay | Admitting: Obstetrics and Gynecology

## 2011-09-20 ENCOUNTER — Ambulatory Visit (INDEPENDENT_AMBULATORY_CARE_PROVIDER_SITE_OTHER): Payer: Medicaid Other | Admitting: Obstetrics and Gynecology

## 2011-09-20 VITALS — BP 110/70 | Ht 62.0 in | Wt 162.0 lb

## 2011-09-20 DIAGNOSIS — Z30430 Encounter for insertion of intrauterine contraceptive device: Secondary | ICD-10-CM

## 2011-09-20 LAB — POCT URINE PREGNANCY: Preg Test, Ur: NEGATIVE

## 2011-09-20 MED ORDER — LEVONORGESTREL 20 MCG/24HR IU IUD
INTRAUTERINE_SYSTEM | Freq: Once | INTRAUTERINE | Status: AC
  Administered 2011-09-20: 1 via INTRAUTERINE

## 2011-09-20 NOTE — Progress Notes (Signed)
UPT: negative GC/CHLAMYDIA: Neg CONSENT SIGNED: yes DISINFECTION WITH betadine X3 UTERUS SOUNDED AT 8-9 CM IUD INSERTED PER PROTOCOL: yes COMPLICATION: none PATIENT INSTRUCTED TO CALL IS FEVER OR ABNORMAL PAIN:yes PATIENT INSTRUCTED ON HOW TO CHECK IUD STRINGS: yes FOLLOW UP APPT: 5wks  Results for orders placed in visit on 09/20/11  POCT URINE PREGNANCY      Component Value Range   Preg Test, Ur Negative      Filed Vitals:   09/20/11 0952  BP: 110/70   ROS: noncontributory  Pelvic exam:  VULVA: normal appearing vulva with no masses, tenderness or lesions,  VAGINA: normal appearing vagina with normal color and discharge, no lesions, CERVIX: normal appearing cervix without discharge or lesions,  UTERUS: uterus is normal size, shape, consistency and nontender,  ADNEXA: normal adnexa in size, nontender and no masses. Well healed incision  A/p F/u in 5wks SE of IUD reviewed Many questions answered 600mg  motrin now with crackers

## 2011-10-27 ENCOUNTER — Encounter: Payer: Medicaid Other | Admitting: Obstetrics and Gynecology

## 2013-11-11 ENCOUNTER — Encounter: Payer: Self-pay | Admitting: Obstetrics and Gynecology

## 2017-08-17 ENCOUNTER — Other Ambulatory Visit (HOSPITAL_COMMUNITY)
Admission: RE | Admit: 2017-08-17 | Discharge: 2017-08-17 | Disposition: A | Discharge status: Home / Self Care | Payer: BLUE CROSS/BLUE SHIELD | Source: Ambulatory Visit | Attending: Nurse Practitioner | Admitting: Nurse Practitioner

## 2017-08-17 ENCOUNTER — Other Ambulatory Visit: Payer: Self-pay | Admitting: Nurse Practitioner

## 2017-08-17 DIAGNOSIS — Z01419 Encounter for gynecological examination (general) (routine) without abnormal findings: Secondary | ICD-10-CM | POA: Diagnosis not present

## 2017-08-17 DIAGNOSIS — Z113 Encounter for screening for infections with a predominantly sexual mode of transmission: Secondary | ICD-10-CM | POA: Diagnosis not present

## 2017-08-22 LAB — CYTOLOGY - PAP
Diagnosis: NEGATIVE
HPV: NOT DETECTED

## 2017-09-05 DIAGNOSIS — H1089 Other conjunctivitis: Secondary | ICD-10-CM | POA: Diagnosis not present

## 2019-04-10 ENCOUNTER — Other Ambulatory Visit: Payer: Self-pay

## 2019-04-10 ENCOUNTER — Ambulatory Visit (HOSPITAL_COMMUNITY)
Admission: EM | Admit: 2019-04-10 | Discharge: 2019-04-10 | Disposition: A | Discharge status: Home / Self Care | Payer: BC Managed Care – PPO | Attending: Family Medicine | Admitting: Family Medicine

## 2019-04-10 ENCOUNTER — Encounter (HOSPITAL_COMMUNITY): Payer: Self-pay

## 2019-04-10 DIAGNOSIS — R1013 Epigastric pain: Secondary | ICD-10-CM | POA: Insufficient documentation

## 2019-04-10 DIAGNOSIS — Z3202 Encounter for pregnancy test, result negative: Secondary | ICD-10-CM | POA: Diagnosis not present

## 2019-04-10 LAB — POCT URINALYSIS DIP (DEVICE)
Bilirubin Urine: NEGATIVE
Glucose, UA: NEGATIVE mg/dL
Hgb urine dipstick: NEGATIVE
Ketones, ur: NEGATIVE mg/dL
Leukocytes,Ua: NEGATIVE
Nitrite: NEGATIVE
Protein, ur: NEGATIVE mg/dL
Specific Gravity, Urine: >=1.03 (ref 1.005–1.030)
Urobilinogen, UA: 0.2 mg/dL (ref 0.0–1.0)
pH: 6 (ref 5.0–8.0)

## 2019-04-10 LAB — COMPREHENSIVE METABOLIC PANEL
ALT: 17 U/L (ref 0–44)
AST: 21 U/L (ref 15–41)
Albumin: 3.9 g/dL (ref 3.5–5.0)
Alkaline Phosphatase: 47 U/L (ref 38–126)
Anion gap: 7 (ref 5–15)
BUN: 5 mg/dL — ABNORMAL LOW (ref 6–20)
CO2: 26 mmol/L (ref 22–32)
Calcium: 9.4 mg/dL (ref 8.9–10.3)
Chloride: 107 mmol/L (ref 98–111)
Creatinine, Ser: 0.69 mg/dL (ref 0.44–1.00)
GFR calc Af Amer: >60 mL/min (ref >60)
GFR calc non Af Amer: >60 mL/min (ref >60)
Glucose, Bld: 85 mg/dL (ref 70–99)
Potassium: 4.2 mmol/L (ref 3.5–5.1)
Sodium: 140 mmol/L (ref 135–145)
Total Bilirubin: 0.5 mg/dL (ref 0.3–1.2)
Total Protein: 6.9 g/dL (ref 6.5–8.1)

## 2019-04-10 LAB — CBC WITH DIFFERENTIAL/PLATELET
Abs Immature Granulocytes: 0.01 10*3/uL (ref 0.00–0.07)
Basophils Absolute: 0 10*3/uL (ref 0.0–0.1)
Basophils Relative: 0 %
Eosinophils Absolute: 0 10*3/uL (ref 0.0–0.5)
Eosinophils Relative: 0 %
HCT: 39.6 % (ref 36.0–46.0)
Hemoglobin: 13.2 g/dL (ref 12.0–15.0)
Immature Granulocytes: 0 %
Lymphocytes Relative: 20 %
Lymphs Abs: 1 10*3/uL (ref 0.7–4.0)
MCH: 29.5 pg (ref 26.0–34.0)
MCHC: 33.3 g/dL (ref 30.0–36.0)
MCV: 88.6 fL (ref 80.0–100.0)
Monocytes Absolute: 0.3 10*3/uL (ref 0.1–1.0)
Monocytes Relative: 6 %
Neutro Abs: 3.5 10*3/uL (ref 1.7–7.7)
Neutrophils Relative %: 74 %
Platelets: 153 10*3/uL (ref 150–400)
RBC: 4.47 MIL/uL (ref 3.87–5.11)
RDW: 13.1 % (ref 11.5–15.5)
WBC: 4.7 10*3/uL (ref 4.0–10.5)
nRBC: 0 % (ref 0.0–0.2)

## 2019-04-10 LAB — POC URINE PREG, ED: Preg Test, Ur: NEGATIVE

## 2019-04-10 LAB — LIPASE, BLOOD: Lipase: 23 U/L (ref 11–51)

## 2019-04-10 LAB — POCT PREGNANCY, URINE: Preg Test, Ur: NEGATIVE

## 2019-04-10 MED ORDER — SUCRALFATE 1 G PO TABS: 1.0000 g | ORAL_TABLET | Freq: Three times a day (TID) | ORAL | 0 refills | Status: DC

## 2019-04-10 MED ORDER — ESOMEPRAZOLE MAGNESIUM 40 MG PO CPDR: 40.0000 mg | DELAYED_RELEASE_CAPSULE | Freq: Every day | ORAL | 0 refills | Status: DC

## 2019-04-10 MED ORDER — ALUM & MAG HYDROXIDE-SIMETH 200-200-20 MG/5ML PO SUSP
ORAL | Status: AC
  Filled 2019-04-10: qty 30

## 2019-04-10 MED ORDER — LIDOCAINE VISCOUS HCL 2 % MT SOLN
OROMUCOSAL | Status: AC
  Filled 2019-04-10: qty 15

## 2019-04-10 MED ORDER — LIDOCAINE VISCOUS HCL 2 % MT SOLN
15.0000 mL | Freq: Once | OROMUCOSAL | Status: AC
  Administered 2019-04-10: 15 mL via ORAL

## 2019-04-10 MED ORDER — ALUM & MAG HYDROXIDE-SIMETH 200-200-20 MG/5ML PO SUSP
30.0000 mL | Freq: Once | ORAL | Status: AC
  Administered 2019-04-10: 30 mL via ORAL

## 2019-04-10 NOTE — Discharge Instructions (Addendum)

## 2019-04-10 NOTE — ED Triage Notes (Signed)
Pt states she has abdominal pain. Pt state she has been taking med for acid reflux x 3 days pt states the pain comes and goes.

## 2019-04-10 NOTE — ED Provider Notes (Signed)
Stephanie Golden   [id number] 04/10/19 Arrival Time: [id number]  ASSESSMENT & PLAN:  1. Abdominal pain, epigastric     Benign abdominal exam. No indications for urgent abdominal/pelvic imaging at this time. UPT and U/A negative. Will treat as dyspepsia.   Meds ordered this encounter  Medications  . AND Linked Order Group   . alum & mag hydroxide-simeth (MAALOX/MYLANTA) 200-200-20 MG/5ML suspension 30 mL   . lidocaine (XYLOCAINE) 2 % viscous mouth solution 15 mL  . esomeprazole (NEXIUM) 40 MG capsule    Sig: Take 1 capsule (40 mg total) by mouth daily.    Dispense:  30 capsule    Refill:  0  . sucralfate (CARAFATE) 1 g tablet    Sig: Take 1 tablet (1 g total) by mouth 4 (four) times daily -  with meals and at bedtime.    Dispense:  28 tablet    Refill:  0    Pending: Labs Reviewed  CBC WITH DIFFERENTIAL/PLATELET  COMPREHENSIVE METABOLIC PANEL  LIPASE, BLOOD      Discharge Instructions     You have been seen today for abdominal pain. Your evaluation was not suggestive of any emergent condition requiring medical intervention at this time. However, some abdominal problems make take more time to appear. Therefore, it is very important for you to pay attention to any new symptoms or worsening of your current condition.  Please return here or to the Emergency Department immediately should you begin to feel worse in any way or have any of the following symptoms: increasing or different abdominal pain, persistent vomiting, inability to drink fluids, fevers, or shaking chills.       Follow-up Information    [facility].   Specialty: Emergency Medicine Why: If symptoms worsen in any way. Contact information: [address] [id number] [place] [phone]           Reviewed expectations re: course of current medical issues. Questions answered. Outlined signs and symptoms indicating need for  more acute intervention. Patient verbalized understanding. After Visit Summary given.   SUBJECTIVE: History from: patient. Stephanie Golden is a 34 y.o. female who presents with complaint of intermittent epigastric abdominal discomfort. Onset gradual, 3 d ago. Discomfort described as aching and burning; without radiation; does not wake her at night. Reports normal flatus. "Feel really gassy though". Symptoms are unchanged since beginning. Fever: absent. Aggravating factors: have not been identified. Alleviating factors: have not been identified. She denies arthralgias, constipation, diarrhea, myalgias, nausea, sweats and vomiting. Appetite: normal. PO intake: normal. Ambulatory with assistance. Urinary symptoms: none. Bowel movements: have not significantly changed. History of similar: no. OTC treatment: OTC "acid and gas medicine" without much relief.  No LMP recorded.   Past Surgical History:  Procedure Laterality Date  . CESAREAN SECTION  08/07/2011   Procedure: CESAREAN SECTION;  Surgeon: Ena Dawley, MD;  Location: [facility];  Service: Gynecology;  Laterality: N/A;  Primary cesarean section with delivery of baby girl at 53. Apgars 9/9.  . stitches to r eye       OBJECTIVE:  Vitals:   04/10/19 1434 04/10/19 1436  BP:  128/78  Pulse:  88  Resp:  16  Temp:  99.9 F (37.7 C)  TempSrc:  Oral  SpO2:  97%  Weight: 69.4 kg     General appearance: alert, oriented, no acute distress HEENT: [place]; AT; oropharynx moist Lungs: unlabored respirations Abdomen: soft; without distention; mild  and poorly localized  tenderness to palpation over epigastrum; normal bowel sounds; without masses or organomegaly; without guarding or rebound tenderness Back: without reported CVA tenderness; FROM at waist Extremities: without LE edema; symmetrical; without gross deformities Skin: warm and dry Neurologic: normal gait Psychological: alert and cooperative; normal mood and affect  Labs: Results for  orders placed or performed during the hospital encounter of 04/10/19  POC urine pregnancy  Result Value Ref Range   Preg Test, Ur NEGATIVE NEGATIVE  POCT urinalysis dip (device)  Result Value Ref Range   Glucose, UA NEGATIVE NEGATIVE mg/dL   Bilirubin Urine NEGATIVE NEGATIVE   Ketones, ur NEGATIVE NEGATIVE mg/dL   Specific Gravity, Urine >=1.030 1.005 - 1.030   Hgb urine dipstick NEGATIVE NEGATIVE   pH 6.0 5.0 - 8.0   Protein, ur NEGATIVE NEGATIVE mg/dL   Urobilinogen, UA 0.2 0.0 - 1.0 mg/dL   Nitrite NEGATIVE NEGATIVE   Leukocytes,Ua NEGATIVE NEGATIVE  Pregnancy, urine POC  Result Value Ref Range   Preg Test, Ur NEGATIVE NEGATIVE   Labs Reviewed  CBC WITH DIFFERENTIAL/PLATELET  COMPREHENSIVE METABOLIC PANEL  LIPASE, BLOOD  POC URINE PREG, ED  POCT URINALYSIS DIP (DEVICE)  POCT PREGNANCY, URINE     No Known Allergies                                             Past Medical History:  Diagnosis Date  . H/O candidiasis 02/22/11   yeast  . H/O rubella 02/22/11   yeast  . H/O varicella   . No pertinent past medical history     Social History   Socioeconomic History  . Marital status: Single    Spouse name: Not on file  . Number of children: Not on file  . Years of education: Not on file  . Highest education level: Not on file  Occupational History  . Not on file  Tobacco Use  . Smoking status: Former Smoker    Packs/day: 0.25    Types: Cigarettes  . Smokeless tobacco: Never Used  Substance and Sexual Activity  . Alcohol use: No  . Drug use: No  . Sexual activity: Yes    Birth control/protection: None  Other Topics Concern  . Not on file  Social History Narrative  . Not on file   Social Determinants of Health   Financial Resource Strain:   . Difficulty of Paying Living Expenses:   Food Insecurity:   . Worried About Charity fundraiser in the Last Year:   . Arboriculturist in the Last Year:   Transportation Needs:   . Film/video editor (Medical):    Marland Kitchen Lack of Transportation (Non-Medical):   Physical Activity:   . Days of Exercise per Week:   . Minutes of Exercise per Session:   Stress:   . Feeling of Stress :   Social Connections:   . Frequency of Communication with Friends and Family:   . Frequency of Social Gatherings with Friends and Family:   . Attends Religious Services:   . Active Member of Clubs or Organizations:   . Attends Archivist Meetings:   Marland Kitchen Marital Status:   Intimate Partner Violence:   . Fear of Current or Ex-Partner:   . Emotionally Abused:   Marland Kitchen Physically Abused:   . Sexually Abused:     Family History  Problem Relation Age of  Onset  . Drug abuse Father   . Diabetes Maternal Uncle   . Hypertension Maternal Uncle   . Cancer Paternal Aunt   . Cancer Maternal Grandmother   . Heart disease Paternal Grandmother   . Hypertension Paternal Grandmother   . Diabetes Paternal Grandmother   . Hypertension Maternal Grandfather   . Anesthesia problems Neg Hx   . Hypotension Neg Hx   . Malignant hyperthermia Neg Hx   . Pseudochol deficiency Neg Hx      Vanessa Kick, MD 04/11/19 1003

## 2019-04-25 ENCOUNTER — Ambulatory Visit: Payer: BC Managed Care – PPO | Attending: Family

## 2019-04-25 DIAGNOSIS — Z23 Encounter for immunization: Secondary | ICD-10-CM

## 2019-04-25 NOTE — Progress Notes (Signed)
   Covid-19 Vaccination Clinic  Name:  DEKIYA OBEIRNE    MRN: [id number] DOB: 20-Aug-1985  04/25/2019  Ms. Sorell was observed post Covid-19 immunization for 15 minutes without incident. She was provided with Vaccine Information Sheet and instruction to access the V-Safe system.   Ms. Kemner was instructed to call 911 with any severe reactions post vaccine: Marland Kitchen Difficulty breathing  . Swelling of face and throat  . A fast heartbeat  . A bad rash all over body  . Dizziness and weakness   Immunizations Administered    Name Date Dose VIS Date Route   Moderna COVID-19 Vaccine 04/25/2019  2:31 PM 0.5 mL 12/11/2018 Intramuscular   Manufacturer: Moderna   Lot: [id number]   [place]:[id number]

## 2019-05-28 ENCOUNTER — Ambulatory Visit: Payer: BC Managed Care – PPO

## 2019-12-25 ENCOUNTER — Other Ambulatory Visit: Payer: Self-pay

## 2019-12-25 ENCOUNTER — Ambulatory Visit (HOSPITAL_COMMUNITY)
Admission: EM | Admit: 2019-12-25 | Discharge: 2019-12-25 | Disposition: A | Discharge status: Home / Self Care | Payer: BC Managed Care – PPO | Attending: Emergency Medicine | Admitting: Emergency Medicine

## 2019-12-25 ENCOUNTER — Encounter (HOSPITAL_COMMUNITY): Payer: Self-pay

## 2019-12-25 DIAGNOSIS — S060X9A Concussion with loss of consciousness of unspecified duration, initial encounter: Secondary | ICD-10-CM

## 2019-12-25 DIAGNOSIS — R55 Syncope and collapse: Secondary | ICD-10-CM

## 2019-12-25 DIAGNOSIS — S0990XA Unspecified injury of head, initial encounter: Secondary | ICD-10-CM

## 2019-12-25 MED ORDER — TIZANIDINE HCL 4 MG PO TABS: 4.0000 mg | ORAL_TABLET | Freq: Three times a day (TID) | ORAL | 0 refills | Status: DC | PRN

## 2019-12-25 MED ORDER — IBUPROFEN 600 MG PO TABS: 600.0000 mg | ORAL_TABLET | Freq: Four times a day (QID) | ORAL | 0 refills | Status: DC | PRN

## 2019-12-25 NOTE — Discharge Instructions (Signed)
Your EKG was normal.  Call the concussion Hotline [phone]) and they should be able to schedule you within 24 to 48 hours.  Take extended milligrams of ibuprofen combined with 1000 mg of Tylenol together 3-4 times a day as needed for pain.  Zanaflex for the muscle soreness.  Go immediately to the ER for the signs and symptoms we discussed-such as worsening headache, strokelike symptoms, fevers above 100.4, recurrent vomiting, or any other concerns  Below is a list of primary care practices who are taking new patients for you to follow-up with.  [facility] internal medicine clinic Ground Floor - [facility], [place], [place], [place] [address] [phone]  [facility] Primary Care at [facility] [address] [place], [place] [address] [phone]  [place] [facility] [place], [place] [address] [phone]  [facility]/Internal Medicine [phone] [place] [address]  [facility]: [place]  [phone]  [place] [facility]: [place]   [phone]  [facility] Family Medicine: [address] [place]  [phone]  [facility] : [address] [phone]  [facility] Primary Care: [address] [place] [address] [phone]  [facility]: [place] [phone]  Dr. Blanchie Serve [place]  [phone]  Dr. Benito Mccreedy, [facility]. [place],  [address]  [phone]  Go to www.goodrx.com to look up your medications. This will give you a list of where you can  find your prescriptions at the most affordable prices. Or ask the pharmacist what the cash price is, or if they have any other discount programs available to help make your medication more affordable. This can be less expensive than what you would pay with insurance.

## 2019-12-25 NOTE — ED Triage Notes (Signed)
Pt c/o headaches and nausea X 6 days. Pt states she fainted last Thursday. Pt states after she fainted she vomited. Pt states she is experiencing neck pain.
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
## 2020-03-01 ENCOUNTER — Emergency Department (HOSPITAL_COMMUNITY): Payer: Self-pay

## 2020-03-01 ENCOUNTER — Other Ambulatory Visit: Payer: Self-pay

## 2020-03-01 ENCOUNTER — Encounter (HOSPITAL_COMMUNITY): Payer: Self-pay

## 2020-03-01 ENCOUNTER — Emergency Department (HOSPITAL_COMMUNITY)
Admission: EM | Admit: 2020-03-01 | Discharge: 2020-03-02 | Disposition: A | Discharge status: Home / Self Care | Payer: Self-pay | Attending: Emergency Medicine | Admitting: Emergency Medicine

## 2020-03-01 DIAGNOSIS — F600 Paranoid personality disorder: Secondary | ICD-10-CM | POA: Insufficient documentation

## 2020-03-01 DIAGNOSIS — R410 Disorientation, unspecified: Secondary | ICD-10-CM | POA: Insufficient documentation

## 2020-03-01 DIAGNOSIS — F29 Unspecified psychosis not due to a substance or known physiological condition: Secondary | ICD-10-CM | POA: Insufficient documentation

## 2020-03-01 DIAGNOSIS — F23 Brief psychotic disorder: Secondary | ICD-10-CM

## 2020-03-01 DIAGNOSIS — Z20822 Contact with and (suspected) exposure to covid-19: Secondary | ICD-10-CM | POA: Insufficient documentation

## 2020-03-01 DIAGNOSIS — Z87891 Personal history of nicotine dependence: Secondary | ICD-10-CM | POA: Insufficient documentation

## 2020-03-01 LAB — COMPREHENSIVE METABOLIC PANEL
ALT: 20 U/L (ref 0–44)
AST: 27 U/L (ref 15–41)
Albumin: 4.7 g/dL (ref 3.5–5.0)
Alkaline Phosphatase: 44 U/L (ref 38–126)
Anion gap: 14 (ref 5–15)
BUN: 8 mg/dL (ref 6–20)
CO2: 23 mmol/L (ref 22–32)
Calcium: 9.6 mg/dL (ref 8.9–10.3)
Chloride: 105 mmol/L (ref 98–111)
Creatinine, Ser: 0.62 mg/dL (ref 0.44–1.00)
GFR, Estimated: >60 mL/min (ref >60)
Glucose, Bld: 84 mg/dL (ref 70–99)
Potassium: 3.5 mmol/L (ref 3.5–5.1)
Sodium: 142 mmol/L (ref 135–145)
Total Bilirubin: 1.2 mg/dL (ref 0.3–1.2)
Total Protein: 8.1 g/dL (ref 6.5–8.1)

## 2020-03-01 LAB — CBC WITH DIFFERENTIAL/PLATELET
Abs Immature Granulocytes: 0.04 10*3/uL (ref 0.00–0.07)
Basophils Absolute: 0 10*3/uL (ref 0.0–0.1)
Basophils Relative: 0 %
Eosinophils Absolute: 0 10*3/uL (ref 0.0–0.5)
Eosinophils Relative: 0 %
HCT: 41.4 % (ref 36.0–46.0)
Hemoglobin: 14 g/dL (ref 12.0–15.0)
Immature Granulocytes: 0 %
Lymphocytes Relative: 12 %
Lymphs Abs: 1.5 10*3/uL (ref 0.7–4.0)
MCH: 29.9 pg (ref 26.0–34.0)
MCHC: 33.8 g/dL (ref 30.0–36.0)
MCV: 88.3 fL (ref 80.0–100.0)
Monocytes Absolute: 1 10*3/uL (ref 0.1–1.0)
Monocytes Relative: 8 %
Neutro Abs: 9.5 10*3/uL — ABNORMAL HIGH (ref 1.7–7.7)
Neutrophils Relative %: 80 %
Platelets: 153 10*3/uL (ref 150–400)
RBC: 4.69 MIL/uL (ref 3.87–5.11)
RDW: 13.3 % (ref 11.5–15.5)
WBC: 11.9 10*3/uL — ABNORMAL HIGH (ref 4.0–10.5)
nRBC: 0 % (ref 0.0–0.2)

## 2020-03-01 LAB — I-STAT BETA HCG BLOOD, ED (MC, WL, AP ONLY): I-stat hCG, quantitative: <5 m[IU]/mL (ref <5)

## 2020-03-01 LAB — ETHANOL: Alcohol, Ethyl (B): <10 mg/dL (ref <10)

## 2020-03-01 LAB — ACETAMINOPHEN LEVEL: Acetaminophen (Tylenol), Serum: <10 ug/mL — ABNORMAL LOW (ref 10–30)

## 2020-03-01 LAB — SALICYLATE LEVEL: Salicylate Lvl: <7 mg/dL — ABNORMAL LOW (ref 7.0–30.0)

## 2020-03-01 MED ORDER — LORAZEPAM 2 MG/ML IJ SOLN
2.0000 mg | Freq: Once | INTRAMUSCULAR | Status: AC
  Administered 2020-03-01: 2 mg via INTRAMUSCULAR
  Filled 2020-03-01: qty 1

## 2020-03-01 MED ORDER — PANTOPRAZOLE SODIUM 40 MG PO TBEC
40.0000 mg | DELAYED_RELEASE_TABLET | Freq: Every day | ORAL | Status: DC
  Filled 2020-03-01 (×2): qty 1

## 2020-03-01 MED ORDER — HALOPERIDOL LACTATE 5 MG/ML IJ SOLN
5.0000 mg | Freq: Once | INTRAMUSCULAR | Status: AC
  Administered 2020-03-01: 5 mg via INTRAMUSCULAR
  Filled 2020-03-01: qty 1

## 2020-03-01 NOTE — ED Notes (Signed)
Family in room given an update on what the next step is in care for patient.

## 2020-03-01 NOTE — ED Triage Notes (Signed)
Pt presents with family for psychiatric evaluation. Pt was here earlier with another pt and was exhibiting strange behavior. Pt is chanting in the room, making weird noises with her mouth, and screaming. MD called to evaluate pt.

## 2020-03-01 NOTE — ED Notes (Signed)
Patient currently doing [name] consult.
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
## 2020-03-01 NOTE — ED Notes (Signed)
Pt is screaming and yelling in the room. Pt is very frantic with her motions and actions in the room. Awaiting a bed where pt can lay down and receive medication and other treatment. Charge RN made aware.
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
## 2020-03-02 ENCOUNTER — Encounter (HOSPITAL_COMMUNITY): Payer: Self-pay | Admitting: Registered Nurse

## 2020-03-02 ENCOUNTER — Emergency Department (HOSPITAL_COMMUNITY): Payer: Self-pay

## 2020-03-02 DIAGNOSIS — F23 Brief psychotic disorder: Secondary | ICD-10-CM | POA: Diagnosis present

## 2020-03-02 LAB — RESP PANEL BY RT-PCR (FLU A&B, COVID) ARPGX2
Influenza A by PCR: NEGATIVE
Influenza B by PCR: NEGATIVE
SARS Coronavirus 2 by RT PCR: NEGATIVE

## 2020-03-02 MED ORDER — OLANZAPINE 5 MG PO TABS: 2.5000 mg | ORAL_TABLET | Freq: Every day | ORAL | Status: DC

## 2020-03-02 NOTE — ED Notes (Addendum)
Patient ambulated to bathroom Urine cup given Patient said she forgot to urinate in cup Patient will need to have assistance in restroom next time to bathroom to obtain a urine specimen as well as a double hat

## 2020-03-02 NOTE — ED Notes (Signed)
Pt given meal tray.
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
## 2020-03-02 NOTE — BH Assessment (Addendum)
[facility] Assessment Progress Note  Per Hampton Abbot, MD, this pt does not require psychiatric hospitalization at this time.  Pt presents under IVC initiated by  EDP Marda Stalker, MD which has been rescinded by Dr Dwyane Dee.  Pt is psychiatrically cleared.  Discharge instructions include referral information for [facility].  Pt will also benefit from a neurology referral.  EDP Charlesetta Shanks, MD and pt's nurse, Levada Dy, have been notified.  Jalene Mullet, [place] Triage Specialist [phone]

## 2020-03-02 NOTE — ED Provider Notes (Signed)
Emergency Medicine Observation Re-evaluation Note  Stephanie Golden is a 35 y.o. female, seen on rounds today.  Pt initially presented to the ED for complaints of Psychiatric Evaluation Currently, the patient is cleared by psychiatry for discharge.Marland Kitchen  Physical Exam  BP 119/81 (BP Location: Left Arm)   Pulse 100   Temp 99.5 F (37.5 C) (Oral)   Resp 19   SpO2 99%  Physical Exam General: Alert with clear mental status.  Speech is normal with normal content.  No respiratory distress.  Movements are coordinated without difficulty. Psych: Patient is situationally appropriate.  She is not agitated.  No signs of hallucination or psychosis.  ED Course / MDM  EKG:    I have reviewed the labs performed to date as well as medications administered while in observation.  Recent changes in the last 24 hours include resolution of prior mental status change.  Cleared by psychiatry.  Plan  Current plan is for discharge with follow-up with neurology and behavioral health.. Patient not under full IVC at this time.   Charlesetta Shanks, MD 03/02/20 1217
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
## 2020-03-02 NOTE — ED Notes (Signed)
Family at bedside. 

## 2020-03-02 NOTE — BH Assessment (Signed)
Margorie John, PA-C recommends inpatient psychiatric treatment. Lavell Luster, [facility] at [facility], confirms adult unit is at capacity. Other facilities will be contacted for placement.   Evelena Peat, [facility], [facility] Triage Specialist [phone]
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
## 2020-03-12 ENCOUNTER — Ambulatory Visit (HOSPITAL_COMMUNITY): Payer: Self-pay | Admitting: Professional

## 2020-03-12 ENCOUNTER — Other Ambulatory Visit: Payer: Self-pay

## 2020-03-12 ENCOUNTER — Telehealth (HOSPITAL_COMMUNITY): Payer: Self-pay | Admitting: Professional

## 2020-03-12 NOTE — Telephone Encounter (Signed)
See call log 

## 2020-03-13 ENCOUNTER — Telehealth (HOSPITAL_COMMUNITY): Payer: Self-pay | Admitting: Professional

## 2020-07-31 ENCOUNTER — Ambulatory Visit (HOSPITAL_COMMUNITY)
Admission: EM | Admit: 2020-07-31 | Discharge: 2020-07-31 | Disposition: A | Discharge status: Other Acute Inpatient Hospital | Payer: No Payment, Other | Attending: Urology | Admitting: Urology

## 2020-07-31 ENCOUNTER — Other Ambulatory Visit: Payer: Self-pay

## 2020-07-31 DIAGNOSIS — F23 Brief psychotic disorder: Secondary | ICD-10-CM | POA: Insufficient documentation

## 2020-07-31 DIAGNOSIS — Z87891 Personal history of nicotine dependence: Secondary | ICD-10-CM | POA: Insufficient documentation

## 2020-07-31 DIAGNOSIS — F29 Unspecified psychosis not due to a substance or known physiological condition: Secondary | ICD-10-CM | POA: Insufficient documentation

## 2020-07-31 DIAGNOSIS — Z79899 Other long term (current) drug therapy: Secondary | ICD-10-CM | POA: Insufficient documentation

## 2020-07-31 DIAGNOSIS — R45851 Suicidal ideations: Secondary | ICD-10-CM | POA: Insufficient documentation

## 2020-07-31 DIAGNOSIS — R4182 Altered mental status, unspecified: Secondary | ICD-10-CM | POA: Insufficient documentation

## 2020-07-31 DIAGNOSIS — F060 Psychotic disorder with hallucinations due to known physiological condition: Secondary | ICD-10-CM | POA: Insufficient documentation

## 2020-07-31 DIAGNOSIS — U071 COVID-19: Secondary | ICD-10-CM | POA: Insufficient documentation

## 2020-07-31 DIAGNOSIS — F22 Delusional disorders: Secondary | ICD-10-CM | POA: Insufficient documentation

## 2020-07-31 MED ORDER — ACETAMINOPHEN 325 MG PO TABS: 650.0000 mg | ORAL_TABLET | Freq: Four times a day (QID) | ORAL | Status: DC | PRN

## 2020-07-31 MED ORDER — ALUM & MAG HYDROXIDE-SIMETH 200-200-20 MG/5ML PO SUSP: 30.0000 mL | ORAL | Status: DC | PRN

## 2020-07-31 MED ORDER — MAGNESIUM HYDROXIDE 400 MG/5ML PO SUSP: 30.0000 mL | Freq: Every day | ORAL | Status: DC | PRN

## 2020-07-31 MED ORDER — OLANZAPINE 2.5 MG PO TABS: 2.5000 mg | ORAL_TABLET | Freq: Every day | ORAL | Status: DC

## 2020-07-31 MED ORDER — TRAZODONE HCL 50 MG PO TABS: 50.0000 mg | ORAL_TABLET | Freq: Every evening | ORAL | Status: DC | PRN

## 2020-07-31 MED ORDER — HYDROXYZINE HCL 25 MG PO TABS: 25.0000 mg | ORAL_TABLET | Freq: Three times a day (TID) | ORAL | Status: DC | PRN

## 2020-07-31 NOTE — BHH Counselor (Addendum)
Dr. Rosita Kea completed First Examination and Affidavit and Petition. These forms were faxed to the evening magistrate at 1700. At this time there is still not a custody order. This Probation officer has reached out the magistrate at [phone], [phone], and [phone] and has not gotten on response from anyone. Dr. Rosita Kea no longer in the building. Will consult with current provider.  This counselor has re-faxed IVC paper worker to Civil Service fast streamer and informed night shift [name] staff, [place]. She agrees to wait for Findings and Custody Order and to alert [facility] when received.

## 2020-07-31 NOTE — ED Notes (Signed)
IVC PAPERWORK REFAXED TO MAGISTRATE.

## 2020-07-31 NOTE — ED Notes (Signed)
Report called to Charge Nurse Calli/[facility], Pending IVC and [employer] transfer.

## 2020-07-31 NOTE — Discharge Instructions (Addendum)
Pt is transferred to ED for medical clearance. Accepting physician Dr . Roxanne Mins.
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
## 2020-07-31 NOTE — BH Assessment (Signed)
[name] triage: Patient presents voluntarily to [facility] for evaluation. Patient appears fearful and paranoid. When this clinician enters the room she begins to tremble and shake her head "no." She is accompanied by her cousin, Simon Rhein, and friend, Erlene Quan. She does not answer any questions. Friends states she was diagnosed with psychosis in 2020 and she states she calls them when things get bad. Yesterday she reached out to them for help. She  did not sleep at all last night. They state they stayed with her but were too afraid to sleep because she acts like she is going to harm them.  Patient is emergent.

## 2020-08-01 ENCOUNTER — Emergency Department (HOSPITAL_COMMUNITY)
Admission: EM | Admit: 2020-08-01 | Discharge: 2020-08-03 | Disposition: A | Discharge status: Home / Self Care | Payer: Self-pay | Attending: Emergency Medicine | Admitting: Emergency Medicine

## 2020-08-01 DIAGNOSIS — F29 Unspecified psychosis not due to a substance or known physiological condition: Secondary | ICD-10-CM | POA: Diagnosis present

## 2020-08-01 DIAGNOSIS — F22 Delusional disorders: Secondary | ICD-10-CM

## 2020-08-01 DIAGNOSIS — Z046 Encounter for general psychiatric examination, requested by authority: Secondary | ICD-10-CM

## 2020-08-01 DIAGNOSIS — R45851 Suicidal ideations: Secondary | ICD-10-CM

## 2020-08-01 DIAGNOSIS — E876 Hypokalemia: Secondary | ICD-10-CM

## 2020-08-01 DIAGNOSIS — U071 COVID-19: Secondary | ICD-10-CM

## 2020-08-01 DIAGNOSIS — F419 Anxiety disorder, unspecified: Secondary | ICD-10-CM

## 2020-08-01 LAB — COMPREHENSIVE METABOLIC PANEL
ALT: 11 U/L (ref 0–44)
AST: 13 U/L — ABNORMAL LOW (ref 15–41)
Albumin: 4.1 g/dL (ref 3.5–5.0)
Alkaline Phosphatase: 43 U/L (ref 38–126)
Anion gap: 8 (ref 5–15)
BUN: <5 mg/dL — ABNORMAL LOW (ref 6–20)
CO2: 24 mmol/L (ref 22–32)
Calcium: 9.4 mg/dL (ref 8.9–10.3)
Chloride: 105 mmol/L (ref 98–111)
Creatinine, Ser: 0.7 mg/dL (ref 0.44–1.00)
GFR, Estimated: >60 mL/min (ref >60)
Glucose, Bld: 97 mg/dL (ref 70–99)
Potassium: 3.3 mmol/L — ABNORMAL LOW (ref 3.5–5.1)
Sodium: 137 mmol/L (ref 135–145)
Total Bilirubin: 1 mg/dL (ref 0.3–1.2)
Total Protein: 7.1 g/dL (ref 6.5–8.1)

## 2020-08-01 LAB — RESP PANEL BY RT-PCR (FLU A&B, COVID) ARPGX2
Influenza A by PCR: NEGATIVE
Influenza B by PCR: NEGATIVE
SARS Coronavirus 2 by RT PCR: POSITIVE — AB

## 2020-08-01 LAB — CBC WITH DIFFERENTIAL/PLATELET
Abs Immature Granulocytes: 0.02 10*3/uL (ref 0.00–0.07)
Basophils Absolute: 0 10*3/uL (ref 0.0–0.1)
Basophils Relative: 0 %
Eosinophils Absolute: 0 10*3/uL (ref 0.0–0.5)
Eosinophils Relative: 0 %
HCT: 38.4 % (ref 36.0–46.0)
Hemoglobin: 12.9 g/dL (ref 12.0–15.0)
Immature Granulocytes: 0 %
Lymphocytes Relative: 13 %
Lymphs Abs: 0.9 10*3/uL (ref 0.7–4.0)
MCH: 29.5 pg (ref 26.0–34.0)
MCHC: 33.6 g/dL (ref 30.0–36.0)
MCV: 87.7 fL (ref 80.0–100.0)
Monocytes Absolute: 0.5 10*3/uL (ref 0.1–1.0)
Monocytes Relative: 7 %
Neutro Abs: 5.4 10*3/uL (ref 1.7–7.7)
Neutrophils Relative %: 80 %
Platelets: 152 10*3/uL (ref 150–400)
RBC: 4.38 MIL/uL (ref 3.87–5.11)
RDW: 13.2 % (ref 11.5–15.5)
WBC: 6.8 10*3/uL (ref 4.0–10.5)
nRBC: 0 % (ref 0.0–0.2)

## 2020-08-01 LAB — I-STAT BETA HCG BLOOD, ED (MC, WL, AP ONLY): I-stat hCG, quantitative: <5 m[IU]/mL (ref <5)

## 2020-08-01 LAB — RAPID URINE DRUG SCREEN, HOSP PERFORMED
Amphetamines: NOT DETECTED
Barbiturates: NOT DETECTED
Benzodiazepines: NOT DETECTED
Cocaine: NOT DETECTED
Opiates: NOT DETECTED
Tetrahydrocannabinol: NOT DETECTED

## 2020-08-01 LAB — SALICYLATE LEVEL: Salicylate Lvl: <7 mg/dL — ABNORMAL LOW (ref 7.0–30.0)

## 2020-08-01 LAB — ETHANOL: Alcohol, Ethyl (B): <10 mg/dL (ref <10)

## 2020-08-01 LAB — ACETAMINOPHEN LEVEL: Acetaminophen (Tylenol), Serum: <10 ug/mL — ABNORMAL LOW (ref 10–30)

## 2020-08-01 MED ORDER — HYDROXYZINE HCL 25 MG PO TABS
25.0000 mg | ORAL_TABLET | Freq: Three times a day (TID) | ORAL | Status: DC | PRN
  Administered 2020-08-02 (×2): 25 mg via ORAL
  Filled 2020-08-01 (×2): qty 1

## 2020-08-01 MED ORDER — OLANZAPINE 5 MG PO TABS
5.0000 mg | ORAL_TABLET | Freq: Every day | ORAL | Status: DC
  Administered 2020-08-01 – 2020-08-02 (×2): 5 mg via ORAL
  Filled 2020-08-01 (×3): qty 1

## 2020-08-01 MED ORDER — ZIPRASIDONE MESYLATE 20 MG IM SOLR
20.0000 mg | Freq: Once | INTRAMUSCULAR | Status: AC
  Administered 2020-08-01: 20 mg via INTRAMUSCULAR
  Filled 2020-08-01: qty 20

## 2020-08-01 NOTE — ED Triage Notes (Signed)
IVCd Pt transferred by [employer] from [facility] for medical clarence d/t confusion.   In triage pt endorses auditory hallucinations and SI. IVCd d/t concerns of bizarre behavior, lack of sleep, and paranoid. DX Psychosis Feb 2022. Denies fever/cough.

## 2020-08-01 NOTE — BH Assessment (Signed)
Per [name] CN, Pt is unable to participate in [name] assessment at this time.  [name] to see pt at a later time.

## 2020-08-01 NOTE — ED Notes (Signed)
Pt continues to sleep.

## 2020-08-01 NOTE — ED Provider Notes (Signed)
Emergency Medicine Provider Triage Evaluation Note  Stephanie Golden , a 35 y.o. female  was evaluated in triage.  Pt complains of IVC.  Sent from [facility] for confusion vs psychosis.  Under IVC for bizarre behavior.  Denies alcohol or drug use.  States that she feels like her body is being "attacked.'  Review of Systems  Positive: Paranoia, anxiousness Negative: Fever, chills  Physical Exam  There were no vitals taken for this visit. Gen:   Awake, no distress   Resp:  Normal effort  MSK:   Moves extremities without difficulty  Other:  Answers my questions appropriately.  Medical Decision Making  Medically screening exam initiated at 12:31 AM.  Appropriate orders placed.  Stephanie Golden was informed that the remainder of the evaluation will be completed by another provider, this initial triage assessment does not replace that evaluation, and the importance of remaining in the ED until their evaluation is complete.  IVC   Montine Circle, PA-C 08/01/20 0033    Ripley Fraise, MD 08/01/20 [phone]

## 2020-08-01 NOTE — ED Notes (Signed)
I spoke with pt extensively about plan (to be placed) and answered pt's questions as best as possible. Pt kept coming out to nurse's station and I told her that she can't do that because of her COVID status, pt said she understood. Pt asking for another phone call. I told her that we don't allow that at night because we encourage pts to sleep as much as possible because it's hard to sleep at the hospital and we can reassess about the phone call in the morning.
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
## 2020-08-01 NOTE — ED Notes (Signed)
Bilateral upper & lower restraints were recently removed, as pt became awake since geodon made her sleepy she consented to this RN that she would cooperate and not bite, hit or any other inappropriate/unsafe behaviors she had done to cause the violent restraints to be applied. Since then she has been sleeping on her side in the bed, blanket was given. She is A/Ox3, confused to situation, no c/o any pain.

## 2020-08-01 NOTE — ED Notes (Signed)
Pt appears to be asleep. NAD noted. No sitter with patient.

## 2020-08-01 NOTE — BH Assessment (Signed)
[name] spoke to [facility], to put pt in a private room to complete [name] assessment.  Clinician will call the nurse in ten minutes.
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
## 2020-08-01 NOTE — ED Notes (Signed)
Pt brought to room 52. Pt ambulates on her own power, gait even and steady. Pt alert and oriented, calm and cooperative. No needs voiced at this time

## 2020-08-01 NOTE — ED Notes (Signed)
Pt is very cooperative and calm, pt asking and granted phone call. Pt speaking on the phone at this time
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
## 2020-08-01 NOTE — ED Notes (Signed)
Pt appears to be asleep. Resting in bed with eyes closed. NAD noted.

## 2020-08-01 NOTE — ED Notes (Signed)
Pt watching tv. NAD noted.

## 2020-08-01 NOTE — Progress Notes (Signed)
Per Olena Leatherwood, patient meets criteria for inpatient treatment. There are no available or appropriate beds at [facility] today. CSW faxed referrals to the following facilities for review:  [facility]  [name] will continue to seek bed placement.  Glennie Isle, MSW, [place], LCAS-A Phone: [phone] Disposition/TOC
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
## 2020-08-02 MED ORDER — LOPERAMIDE HCL 2 MG PO CAPS
4.0000 mg | ORAL_CAPSULE | Freq: Once | ORAL | Status: AC
  Administered 2020-08-02: 4 mg via ORAL
  Filled 2020-08-02: qty 2

## 2020-08-02 NOTE — ED Notes (Signed)
Pt ambulatory to restroom and steady on feet. Pt now has a Actuary. NAD  Noted.

## 2020-08-02 NOTE — BHH Counselor (Addendum)
[name] re-assessed patient:   [name] re-assessed patient, when asked how do you feel patient stated, "One minute I feel fine and the next minute I still feel like I am going through that Bipolar/Schizophrenia epsoide. Report she continues to feels paranoid, fidgety as if she cannot control her muscles. Patient report she continues to feel something is off with her. Patient discussed stressors such as working 68rd shift, 34-year-old daughter birthday coming up, and bills.   Patient she does not have a psychiatrist and currently takes any medication for Bipolar/Schizophrenia.  Patient denied suicidal/homicidal ideations. Report auditory hallucinations and denied visual hallucinations. Report she wants to get better because she does not want her mental health to cause her to lose custody of her 25-year-old daughter.   Disposition: Per Olena Leatherwood, patient meets criteria for inpatient treatment.

## 2020-08-02 NOTE — ED Notes (Signed)
Patient educated on current IVC and pending inpatient placement status.

## 2020-08-02 NOTE — ED Notes (Signed)
Pt appears to be asleep. NAD noted. Sitter remains. 

## 2020-08-02 NOTE — Progress Notes (Signed)
Per Olena Leatherwood, patient meets criteria for inpatient treatment. There are no available or appropriate beds at [facility] today. CSW re-faxed referrals to the following facilities for review:  [place] [facility] [place] [address] [phone] --  [place] [address] [place] [address] [id number] --  [place], [place] [address] [phone] --  [place] [address] [place], [place] [address] [phone] --  [place] [address] [place] [address] [phone] --  [facility]  Pending - [place] [address], [place] [address] [place] [address] [place] [address] [phone] --  [facility]  Pending - Request Sent N/A [id number] Jeanene Erb [place] [address] [phone] --  [facility]  Pending - Request Sent N/A [address] [place] [address] [phone] --  [facility]  Pending - Request Sent N/A [address] [place] [address] [id number] --  [facility]  Pending - Request Sent N/A [address], [place] [address] [phone] --  [facility]  Pending - Request Sent N/A [address] Wandra Feinstein [place] [address] [phone] --  [place]  Pending - Request Sent N/A [address] [place] [address] [phone] --   [facility]  Pending - Request Sent N/A [place], [place] [address] [id number] [phone] --  [facility]  Pending - Request Sent N/A [address] [place] [address] [phone] --  [facility] [address], [place] [address] [phone] --   [name] will continue to seek bed placement.  Glennie Isle, MSW, [place], LCAS-A Phone: [phone] Disposition/TOC

## 2020-08-02 NOTE — ED Notes (Signed)
Pt continues to sleep. NAD noted. Sitter remains. 

## 2020-08-02 NOTE — ED Notes (Signed)
Belongings in locker #3 

## 2020-08-03 ENCOUNTER — Encounter (HOSPITAL_COMMUNITY): Payer: Self-pay | Admitting: Registered Nurse

## 2020-08-03 DIAGNOSIS — Z046 Encounter for general psychiatric examination, requested by authority: Secondary | ICD-10-CM | POA: Insufficient documentation

## 2020-08-03 DIAGNOSIS — F29 Unspecified psychosis not due to a substance or known physiological condition: Secondary | ICD-10-CM | POA: Insufficient documentation

## 2020-08-03 DIAGNOSIS — F209 Schizophrenia, unspecified: Secondary | ICD-10-CM | POA: Insufficient documentation

## 2020-08-03 MED ORDER — OLANZAPINE 5 MG PO TABS: 5.0000 mg | ORAL_TABLET | Freq: Every day | ORAL | 0 refills | Status: DC

## 2020-08-03 NOTE — Progress Notes (Signed)
Patient information has been sent to [facility] via secure chat to review for potential admission. Patient meets inpatient criteria per Merlyn Lot, NP.   Situation ongoing, CSW will continue to monitor progress.    Signed:  Mariea Clonts, MSW, LCSW-A  08/03/2020 8:02 AM

## 2020-08-03 NOTE — ED Notes (Signed)
Pt keeps coming out of room and asking for RN. Pt has been told multiple times to stay in room but patient is not listening . Pt reports Sharalyn Ink is the only person we can talk to or contact. Sharalyn Ink # [phone]. Pt has repeated multiple times that she is not going to stay another night and that she is leaving tonight.

## 2020-08-03 NOTE — Discharge Instructions (Addendum)
It was our pleasure to provide your ER care today - we hope that you feel better.  Your covid test is positive - see attached info.  Take zyprexa as prescribed.  Follow up with primary care doctor and mental health provider in the coming week.  From the labs, your potassium level is slightly low - eat plenty of fruits and vegetables, and follow up with primary care doctor.   For mental health issues and/or crisis, you may also go directly to the [facility] - it is open 24/7 and walk-ins are welcome.   Return to ER if worse, new symptoms, increased trouble breathing, or other emergency concern.

## 2020-08-03 NOTE — Consult Note (Signed)
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 female patient admitted to [facility] ED after she was sent from [facility] where she had presented as a walk-in with her Cousin and complaints of agitation and psychosis.  HPI:  Stephanie Golden, 82 y.o., female patient seen via tele health by this provider, consulted with Dr. Hampton Abbot; and chart reviewed on 08/03/20.  On evaluation Stephanie Golden reports she was brought to the hospital after she called her cousin because she was not feeling good.  "I basically I told her I was not feeling good and after I smoked my black a mile I was hearing voices telling me if he smoked a black a mile you will have to go out and do drugs and sister.  And no was voices and I know I did not have to do what they said."  Patient reported she is doing better states the voices is not as loud.  Patient reported she needs to go home because she feels being in the hospital.  "You have to stay in this little room."  At this time patient denies suicidal/self-harm/homicidal ideation and paranoia.  Patient reports she continues to hear voices but they are not as bad as they were prior to coming to the hospital.  Patient reports that the voices never go away and that they are usually not a problem.  Patient gave permission to speak with her cousin Stephanie Golden for collateral information at [phone]. During evaluation Stacye L Clowes is sitting on side of bed in no acute distress.  She is alert, oriented x 4, calm and cooperative.  Her mood is anxious, irritable but  euthymic with congruent affect.  She does not appear to be responding to internal/external stimuli or delusional thoughts; but reports she continues to hear auditory hallucinations.  Reporting the voices are softer and are better than they were prior to her admission to the hospital..  Patient denies suicidal/self-harm/homicidal ideation, and paranoia.  Patient answered question appropriately.  Collateral Information:  Spoke to patient's cousin  [name] at [phone].:  Stephanie Golden reports she is spoken to the patient several times since she has been in the hospital.  Reports she does not feel that the patient is harmful to herself or anyone else.  States she is aware the patient is still hearing voices but is better than they were prior to her coming to the hospital.  Feels that it would be okay for patient to come home.  States that patient and her daughter will be staying with her for a while.  Also discussed patient being COVID-positive and everyone in the needing to be tested and isolation precautions needed to take.  States patient will need a prescription for medication until she can see her provider.  Past Psychiatric History: Psychosis  Risk to Self: No Risk to Others: No Prior Inpatient Therapy: No Prior Outpatient Therapy: None  Past Medical History:  Past Medical History:  Diagnosis Date   H/O candidiasis 02/22/11   yeast   H/O rubella 02/22/11   yeast   H/O varicella    No pertinent  past medical history     Past Surgical History:  Procedure Laterality Date   CESAREAN SECTION  08/07/2011   Procedure: CESAREAN SECTION;  Surgeon: Ena Dawley, MD;  Location: [facility];  Service: Gynecology;  Laterality: N/A;  Primary cesarean section with delivery of baby girl at 94. Apgars 9/9.   stitches to r eye     Family History:  Family History  Problem Relation Age of Onset   Drug abuse Father    Diabetes Maternal Uncle    Hypertension Maternal Uncle    Cancer Paternal Aunt     Cancer Maternal Grandmother    Heart disease Paternal Grandmother    Hypertension Paternal Grandmother    Diabetes Paternal Grandmother    Hypertension Maternal Grandfather    Anesthesia problems Neg Hx    Hypotension Neg Hx    Malignant hyperthermia Neg Hx    Pseudochol deficiency Neg Hx    Family Psychiatric  History: See above Social History:  Social History   Substance and Sexual Activity  Alcohol Use No     Social History   Substance and Sexual Activity  Drug Use No    Social History   Socioeconomic History   Marital status: Single    Spouse name: Not on file   Number of children: Not on file   Years of education: Not on file   Highest education level: Not on file  Occupational History   Not on file  Tobacco Use   Smoking status: Former    Packs/day: 0.25    Types: Cigarettes   Smokeless tobacco: Never  Substance and Sexual Activity   Alcohol use: No   Drug use: No   Sexual activity: Yes    Birth control/protection: None  Other Topics Concern   Not on file  Social History Narrative   Not on file   Social Determinants of Health   Financial Resource Strain: Not on file  Food Insecurity: Not on file  Transportation Needs: Not on file  Physical Activity: Not on file  Stress: Not on file  Social Connections: Not on file   Additional Social History:    Allergies:  No Known Allergies  Labs: No results found for this or any previous visit (from the past 48 hour(s)).  Medications:  Current Facility-Administered Medications  Medication Dose Route Frequency Provider Last Rate Last Admin   hydrOXYzine (ATARAX/VISTARIL) tablet 25 mg  25 mg Oral TID PRN Merlyn Lot E, NP   25 mg at 08/02/20 2105   OLANZapine (ZYPREXA) tablet 5 mg  5 mg Oral QHS Merlyn Lot E, NP   5 mg at 08/02/20 2105   No current outpatient medications on file.    Musculoskeletal: Strength & Muscle Tone: within normal limits Gait & Station: normal Patient leans:  N/A    Psychiatric Specialty Exam:  Presentation  General Appearance: Appropriate for Environment  Eye Contact:Good  Speech:Clear and Coherent; Pressured  Speech Volume:Normal  Handedness:Right   Mood and Affect  Mood:Anxious; Irritable  Affect:Congruent   Thought Process  Thought Processes:Coherent; Goal Directed  Descriptions of Associations:Intact  Orientation:Full (Time, Place and Person)  Thought Content:Rumination; WDL  History of Schizophrenia/Schizoaffective disorder:No  Duration of Psychotic Symptoms:Greater than six months  Hallucinations:Hallucinations: Auditory Description of Auditory Hallucinations: Patient reporting she hears voices on and off but has been going on for a while.  States that the voices are not negative; and when she has a problem sleeping because of the voices she calls her cousin  Ideas  of Reference:Paranoia  Suicidal Thoughts:Suicidal Thoughts: No  Homicidal Thoughts:Homicidal Thoughts: No   Sensorium  Memory:Immediate Good; Recent Good  Judgment:Fair  Insight:Present   Executive Functions  Concentration:Fair  Attention Span:Fair  [place] of Knowledge:Good  Language:Good   Psychomotor Activity  Psychomotor Activity:Psychomotor Activity: Restlessness   Assets  Assets:Communication Skills; Desire for Improvement; Housing; Leisure Time; Resilience; Social Support; Transportation   Sleep  Sleep:Sleep: Good    Physical Exam: Physical Exam Vitals and nursing note reviewed. Exam conducted with a chaperone present.  Constitutional:      General: She is not in acute distress.    Appearance: Normal appearance. She is not ill-appearing.  Cardiovascular:     Rate and Rhythm: Normal rate.  Pulmonary:     Effort: Pulmonary effort is normal.  Neurological:     Mental Status: She is alert and oriented to person, place, and time.  Psychiatric:        Attention and Perception: She perceives auditory  hallucinations.        Mood and Affect: Mood is anxious.        Speech: Speech is rapid and pressured.        Behavior: Behavior is cooperative.        Thought Content: Thought content normal. Thought content is not paranoid or delusional. Thought content does not include homicidal or suicidal ideation.        Cognition and Memory: Cognition and memory normal.        Judgment: Judgment is impulsive.   Review of Systems  Constitutional: Negative.   HENT: Negative.    Eyes: Negative.   Respiratory: Negative.    Cardiovascular: Negative.   Gastrointestinal: Negative.   Genitourinary: Negative.   Musculoskeletal: Negative.   Skin: Negative.   Neurological: Negative.   Endo/Heme/Allergies: Negative.   Psychiatric/Behavioral:  Positive for hallucinations (Reporting she continues to hear voices but they are whispers at this time better than they were prior to her admission). Negative for substance abuse. Depression: Stable. Suicidal ideas: Denies.The patient is nervous/anxious (Reporting that being in hospital is making her more anxious). Insomnia: Reporting medication helped her sleep.       Patient stating she is feeling better and would prefer to go home with medication and follow-up with outpatient psychiatric services.  Patient reporting the voices never go away and whenever they get louder she calls her cousin to assist her and her daughter.  Blood pressure 125/70, pulse 75, temperature 98.3 F (36.8 C), temperature source Oral, resp. rate 18, height '5\' 2"'$  (1.575 m), weight 69.4 kg, SpO2 97 %. Body mass index is 27.98 kg/m.  Treatment Plan Summary: Plan psychiatrically clear.  Social work consult to assist patient with outpatient psychiatric services for medication management and therapy  Disposition: Psychiatrically clear No evidence of imminent risk to self or others at present.   Supportive therapy provided about ongoing stressors. Refer to IOP. Discussed crisis plan, support from  social network, calling 911, coming to the Emergency Department, and calling Suicide Hotline.  This service was provided via telemedicine using a 2-way, interactive audio and video technology.  Names of all persons participating in this telemedicine service and their role in this encounter. Name: Earleen Newport Role: NP  Name: Dr. Hampton Abbot Role: Psychiatrist  Name: Veleta Miners Role: Patient  Name: Elesa Hacker Role: Patient's cousin for collateral information   Sent a secure message to patient's nurse Duanne Guess informing:  Psychiatric reassessment complete.  Patient psychiatrically cleared.  Patient will  need a prescription for Zyprexa 5 mg Q hs at least a 30-day supply.  Consult ordered Social work/TOC to assist with resources/referral for outpatient psychiatric services for medication management and therapy.  Patients' cousin will be picking her up.  Please inform MD only default listed.   Celena Lanius, NP 08/03/2020 2:51 PM

## 2020-08-03 NOTE — Social Work (Signed)
CSW covering remotely spoke with Pt via telephone.  Pt did not want services for outpatient therapy but did want services for medication management.  CSW and Pt discussed how best to access [facility] services through walk-in method.  CSW and Pt discussed the importance of preventative care and seeing a provider. Pt reports that she is currently very busy with her daughter and her job but states that she will seek services at [facility] for med management.   Information added to AVS.

## 2020-08-03 NOTE — ED Provider Notes (Signed)
Emergency Medicine Observation Re-evaluation Note  Stephanie Golden is a 35 y.o. female, seen on rounds today.  Pt initially presented to the ED for complaints of anxiety and paranoid ideation.  Currently pt reports feeling much improved, and feels ready for discharge. States will take meds as prescribes and will f/u as outpt.    Physical Exam  BP 125/70 (BP Location: Right Arm)   Pulse 70   Temp 98.3 F (36.8 C) (Oral)   Resp 18   Ht 1.575 m ('5\' 2"'$ )   Wt 69.4 kg   SpO2 97%   BMI 27.98 kg/m  Physical Exam General: alert, content. Asking for something to eat/drink - provided.  Cardiac: regular rate Lungs: breathing comfortably Psych: alert, pleasant, conversant. Normal mood and affect. Does not appear to be responding to internal stimuli - no delusions or hallucinations. No thoughts of harm to self or others.   ED Course / MDM  EKG:   I have reviewed the labs performed to date as well as medications administered while in observation.  Recent changes in the last 24 hours include stabilization on meds, BH reassessment.   Plan  [facility] team has reassessed and indicates psych clear for d/c. Requests pt be given rx zyprexa 5 mg qhs - provided.   Pt currently appears stable for d/c per [facility] plan.      Lajean Saver, MD 08/03/20 1630

## 2020-09-13 ENCOUNTER — Other Ambulatory Visit: Payer: Self-pay

## 2020-09-13 ENCOUNTER — Emergency Department (HOSPITAL_COMMUNITY)
Admission: EM | Admit: 2020-09-13 | Discharge: 2020-09-15 | Disposition: A | Discharge status: Home / Self Care | Payer: Self-pay | Attending: Emergency Medicine | Admitting: Emergency Medicine

## 2020-09-13 ENCOUNTER — Encounter (HOSPITAL_COMMUNITY): Payer: Self-pay | Admitting: Emergency Medicine

## 2020-09-13 ENCOUNTER — Ambulatory Visit (HOSPITAL_COMMUNITY)
Admission: EM | Admit: 2020-09-13 | Discharge: 2020-09-13 | Disposition: A | Discharge status: Other Acute Inpatient Hospital | Payer: No Payment, Other | Attending: Psychiatry | Admitting: Psychiatry

## 2020-09-13 DIAGNOSIS — Z79899 Other long term (current) drug therapy: Secondary | ICD-10-CM | POA: Insufficient documentation

## 2020-09-13 DIAGNOSIS — F060 Psychotic disorder with hallucinations due to known physiological condition: Secondary | ICD-10-CM | POA: Insufficient documentation

## 2020-09-13 DIAGNOSIS — F29 Unspecified psychosis not due to a substance or known physiological condition: Secondary | ICD-10-CM | POA: Diagnosis present

## 2020-09-13 DIAGNOSIS — F23 Brief psychotic disorder: Secondary | ICD-10-CM | POA: Insufficient documentation

## 2020-09-13 DIAGNOSIS — Y900 Blood alcohol level of less than 20 mg/100 ml: Secondary | ICD-10-CM | POA: Insufficient documentation

## 2020-09-13 DIAGNOSIS — Z8616 Personal history of COVID-19: Secondary | ICD-10-CM | POA: Insufficient documentation

## 2020-09-13 DIAGNOSIS — Z87891 Personal history of nicotine dependence: Secondary | ICD-10-CM | POA: Insufficient documentation

## 2020-09-13 DIAGNOSIS — Z20822 Contact with and (suspected) exposure to covid-19: Secondary | ICD-10-CM | POA: Insufficient documentation

## 2020-09-13 LAB — CBC WITH DIFFERENTIAL/PLATELET
Abs Immature Granulocytes: 0.01 10*3/uL (ref 0.00–0.07)
Basophils Absolute: 0 10*3/uL (ref 0.0–0.1)
Basophils Relative: 0 %
Eosinophils Absolute: 0 10*3/uL (ref 0.0–0.5)
Eosinophils Relative: 0 %
HCT: 38.3 % (ref 36.0–46.0)
Hemoglobin: 13 g/dL (ref 12.0–15.0)
Immature Granulocytes: 0 %
Lymphocytes Relative: 18 %
Lymphs Abs: 1.4 10*3/uL (ref 0.7–4.0)
MCH: 30.1 pg (ref 26.0–34.0)
MCHC: 33.9 g/dL (ref 30.0–36.0)
MCV: 88.7 fL (ref 80.0–100.0)
Monocytes Absolute: 0.4 10*3/uL (ref 0.1–1.0)
Monocytes Relative: 6 %
Neutro Abs: 6 10*3/uL (ref 1.7–7.7)
Neutrophils Relative %: 76 %
Platelets: 152 10*3/uL (ref 150–400)
RBC: 4.32 MIL/uL (ref 3.87–5.11)
RDW: 13.3 % (ref 11.5–15.5)
WBC: 7.9 10*3/uL (ref 4.0–10.5)
nRBC: 0 % (ref 0.0–0.2)

## 2020-09-13 LAB — ETHANOL: Alcohol, Ethyl (B): <10 mg/dL (ref <10)

## 2020-09-13 LAB — RESP PANEL BY RT-PCR (FLU A&B, COVID) ARPGX2
Influenza A by PCR: NEGATIVE
Influenza B by PCR: NEGATIVE
SARS Coronavirus 2 by RT PCR: NEGATIVE

## 2020-09-13 LAB — COMPREHENSIVE METABOLIC PANEL
ALT: 14 U/L (ref 0–44)
AST: 19 U/L (ref 15–41)
Albumin: 4.2 g/dL (ref 3.5–5.0)
Alkaline Phosphatase: 47 U/L (ref 38–126)
Anion gap: 9 (ref 5–15)
BUN: 6 mg/dL (ref 6–20)
CO2: 22 mmol/L (ref 22–32)
Calcium: 9.4 mg/dL (ref 8.9–10.3)
Chloride: 105 mmol/L (ref 98–111)
Creatinine, Ser: 0.6 mg/dL (ref 0.44–1.00)
GFR, Estimated: >60 mL/min (ref >60)
Glucose, Bld: 82 mg/dL (ref 70–99)
Potassium: 3.4 mmol/L — ABNORMAL LOW (ref 3.5–5.1)
Sodium: 136 mmol/L (ref 135–145)
Total Bilirubin: 0.8 mg/dL (ref 0.3–1.2)
Total Protein: 7.5 g/dL (ref 6.5–8.1)

## 2020-09-13 LAB — RAPID URINE DRUG SCREEN, HOSP PERFORMED
Amphetamines: NOT DETECTED
Barbiturates: NOT DETECTED
Benzodiazepines: NOT DETECTED
Cocaine: NOT DETECTED
Opiates: NOT DETECTED
Tetrahydrocannabinol: NOT DETECTED

## 2020-09-13 LAB — I-STAT BETA HCG BLOOD, ED (MC, WL, AP ONLY): I-stat hCG, quantitative: <5 m[IU]/mL (ref <5)

## 2020-09-13 LAB — ACETAMINOPHEN LEVEL: Acetaminophen (Tylenol), Serum: <10 ug/mL — ABNORMAL LOW (ref 10–30)

## 2020-09-13 LAB — SALICYLATE LEVEL: Salicylate Lvl: <7 mg/dL — ABNORMAL LOW (ref 7.0–30.0)

## 2020-09-13 MED ORDER — ONDANSETRON HCL 4 MG PO TABS: 4.0000 mg | ORAL_TABLET | Freq: Three times a day (TID) | ORAL | Status: DC | PRN

## 2020-09-13 MED ORDER — NICOTINE 21 MG/24HR TD PT24
21.0000 mg | MEDICATED_PATCH | Freq: Every day | TRANSDERMAL | Status: DC
  Administered 2020-09-13 – 2020-09-14 (×2): 21 mg via TRANSDERMAL
  Filled 2020-09-13 (×3): qty 1

## 2020-09-13 MED ORDER — OLANZAPINE 5 MG PO TABS
5.0000 mg | ORAL_TABLET | Freq: Every day | ORAL | Status: DC
  Administered 2020-09-13 – 2020-09-14 (×2): 5 mg via ORAL
  Filled 2020-09-13 (×3): qty 1

## 2020-09-13 MED ORDER — ALUM & MAG HYDROXIDE-SIMETH 200-200-20 MG/5ML PO SUSP: 30.0000 mL | Freq: Four times a day (QID) | ORAL | Status: DC | PRN

## 2020-09-13 MED ORDER — ACETAMINOPHEN 325 MG PO TABS: 650.0000 mg | ORAL_TABLET | ORAL | Status: DC | PRN
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
## 2020-09-13 NOTE — ED Notes (Signed)
belongings placed in locker 1

## 2020-09-13 NOTE — ED Notes (Signed)
Pt changed into burgundy scrubs and security called to wand pt.

## 2020-09-13 NOTE — ED Provider Notes (Signed)
Emergency Medicine Provider Triage Evaluation Note  Stephanie Golden , a 35 y.o. female  was evaluated in triage.  Pt complains of suicidal ideations, manic episode, and auditory hallucinations.  Patient reports that she has suicidal ideations but denies any plan.  Patient reports that she is hearing voices.  The voices are sometimes commanding, sometimes commanding her to harm other people.  Patient is not compliant with medications.  Patient is under IVC by family.  Patient denies any illicit drug use.  Patient endorses occasional alcohol use.  Review of Systems  Positive: SI, auditory hallucinations Negative: Homicidal ideations, visual hallucinations  Physical Exam  BP 111/82 (BP Location: Left Arm)   Pulse 83   Temp 99.1 F (37.3 C) (Oral)   Resp 16   LMP 09/10/2020   SpO2 100%  Gen:   Awake, no distress   Resp:  Normal effort  MSK:   Moves extremities without difficulty  Other:    Medical Decision Making  Medically screening exam initiated at 4:28 PM.  Appropriate orders placed.  Stephanie Golden was informed that the remainder of the evaluation will be completed by another provider, this initial triage assessment does not replace that evaluation, and the importance of remaining in the ED until their evaluation is complete.  Patient will be held in triage area until room is available in the back.   Loni Beckwith, PA-C 09/13/20 [place], Ankit, MD 09/17/20 [phone]
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
## 2020-09-13 NOTE — ED Notes (Signed)
First exam needed, EDP made aware
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
## 2020-09-14 MED ORDER — POTASSIUM CHLORIDE CRYS ER 20 MEQ PO TBCR
40.0000 meq | EXTENDED_RELEASE_TABLET | Freq: Once | ORAL | Status: AC
  Administered 2020-09-14: 40 meq via ORAL
  Filled 2020-09-14: qty 2

## 2020-09-14 MED ORDER — LORAZEPAM 1 MG PO TABS
1.0000 mg | ORAL_TABLET | Freq: Once | ORAL | Status: AC
  Administered 2020-09-14: 1 mg via ORAL
  Filled 2020-09-14: qty 1

## 2020-09-14 MED ORDER — DIPHENHYDRAMINE HCL 25 MG PO CAPS
25.0000 mg | ORAL_CAPSULE | Freq: Once | ORAL | Status: AC
  Administered 2020-09-14: 25 mg via ORAL
  Filled 2020-09-14: qty 1

## 2020-09-14 MED ORDER — DIPHENHYDRAMINE HCL 50 MG/ML IJ SOLN
25.0000 mg | Freq: Once | INTRAMUSCULAR | Status: DC
  Filled 2020-09-14: qty 1

## 2020-09-14 NOTE — ED Notes (Signed)
Pt pacing in room. Pt stating she is feeling overwhelmed and that she would like something to help calm her nerves. EDP has been notified.

## 2020-09-14 NOTE — ED Notes (Signed)
Breakfast orders placed 

## 2020-09-14 NOTE — ED Notes (Signed)
PT ambulating to bathroom with no noted difficulties

## 2020-09-14 NOTE — ED Notes (Signed)
Pt awake and pacing near desk, constantly needs redirection to not wander off unit or into other pt rooms

## 2020-09-14 NOTE — ED Notes (Signed)
Pt's mother called, updated. Pt's mother requested to talk to Education officer, museum, informed SW.

## 2020-09-14 NOTE — ED Notes (Signed)
Pt pacing in and out of room, pacing near nurse's station looking at doors and other department rooms, pt appears paranoid, however, is redirectable

## 2020-09-14 NOTE — ED Notes (Signed)
When administering zyprexa this evening the pt mentioned that she is concerned the dose isnt strong enough because she takes '5mg'$  at home at she still hears the voices some.  She also states that she works 12 hr shifts, sometimes days, sometimes nights.  So she takes something like NoDoze from [facility] to help her stay awake when working nights.  I got the impression that she takes the Zyprexa as a nighttime dose at home and is trying to counteract the sedative effects of it when working nights.  I told her I would make a note about it and encouraged her to discuss this during her inpatient stay as well to see what alternatives there were medially or otherwise. She agreed.

## 2020-09-14 NOTE — ED Notes (Signed)
Moishe Spice mother [phone] would like to speak with the patient

## 2020-09-14 NOTE — ED Notes (Signed)
Meal tray provided. Pt is eating.

## 2020-09-14 NOTE — Progress Notes (Signed)
Patient continues to meet criteria for [facility] inpatient and has been faxed out under the recommendation of Dr. Dwyane Dee. Patient meets inpatient criteria per Darrol Angel ,NP. Patient referred to the following facilities:  [facility]  [address] [place] [address] [phone] [place]  [address], [place] [address] [phone] [place]  [address] [place] [address] [phone]  [facility]  [address], [place] [address] [phone] [facility]  [place], [place] [address] [phone]  [facility]  [address] [place], [place] [address] [phone]  [facility]  [phone] [place] [address] [phone] [facility]  [place] [address] [place] [address] [phone] [facility]  [address] [place] [address] [phone]  [facility]  [address] [place] [address] [place]  [facility]  [address], [place] [address] [phone]    CSW will continue to monitor disposition.    Mariea Clonts, MSW, LCSW-A  9:58 AM 09/14/2020

## 2020-09-14 NOTE — ED Notes (Signed)
Patient made phone call.

## 2020-09-15 ENCOUNTER — Other Ambulatory Visit (HOSPITAL_COMMUNITY): Payer: Self-pay

## 2020-09-15 ENCOUNTER — Encounter (HOSPITAL_COMMUNITY): Payer: Self-pay | Admitting: Registered Nurse

## 2020-09-15 DIAGNOSIS — F29 Unspecified psychosis not due to a substance or known physiological condition: Secondary | ICD-10-CM

## 2020-09-15 DIAGNOSIS — R440 Auditory hallucinations: Secondary | ICD-10-CM

## 2020-09-15 DIAGNOSIS — F23 Brief psychotic disorder: Secondary | ICD-10-CM

## 2020-09-15 MED ORDER — OLANZAPINE 5 MG PO TABS
5.0000 mg | ORAL_TABLET | Freq: Every day | ORAL | 0 refills | Status: DC
  Filled 2020-09-15: qty 7, 7d supply, fill #0

## 2020-09-15 MED ORDER — TRAZODONE HCL 50 MG PO TABS: 50.0000 mg | ORAL_TABLET | Freq: Every evening | ORAL | Status: DC | PRN

## 2020-09-15 MED ORDER — OLANZAPINE 5 MG PO TABS: 5.0000 mg | ORAL_TABLET | Freq: Every day | ORAL | Status: DC

## 2020-09-15 MED ORDER — OLANZAPINE 5 MG PO TABS
5.0000 mg | ORAL_TABLET | Freq: Every day | ORAL | 0 refills | Status: DC
Start: 2020-09-15 — End: 2020-09-15

## 2020-09-15 MED ORDER — TRAZODONE HCL 50 MG PO TABS
50.0000 mg | ORAL_TABLET | Freq: Every day | ORAL | 0 refills | Status: DC
  Filled 2020-09-15: qty 7, 7d supply, fill #0

## 2020-09-15 MED ORDER — OLANZAPINE 5 MG PO TABS
5.0000 mg | ORAL_TABLET | Freq: Every day | ORAL | 0 refills | Status: DC
  Filled 2020-09-15: qty 77, 77d supply, fill #0

## 2020-09-15 MED ORDER — TRAZODONE HCL 50 MG PO TABS: 50.0000 mg | ORAL_TABLET | Freq: Every day | ORAL | 0 refills | Status: DC

## 2020-09-15 NOTE — ED Notes (Signed)
Patient given discharge instructions, all questions answered. Patient in possession of all belongings, directed to the discharge area  

## 2020-09-15 NOTE — ED Provider Notes (Signed)
Emergency Medicine Observation Re-evaluation Note  Stephanie Golden is a 35 y.o. female, seen on rounds today.  Pt initially presented to the ED for complaints of IVC Currently, the patient is calm.Marland Kitchen  Physical Exam  BP 94/64 (BP Location: Left Arm)   Pulse 82   Temp 98.8 F (37.1 C) (Oral)   Resp 17   LMP 09/10/2020   SpO2 98%  Physical Exam General: Nontoxic appearance Cardiac: Normal heart rate Lungs: Normal respiratory Psych: Not responding to internal stimuli  ED Course / MDM  EKG:   I have reviewed the labs performed to date as well as medications administered while in observation.  Recent changes in the last 24 hours include she has been cooperative..  Plan  Current plan is for outpatient follow-up tomorrow morning at [facility]. IVC rescinded by me Stephanie Golden is under involuntary commitment.      Daleen Bo, MD 09/15/20 1450

## 2020-09-15 NOTE — Discharge Instructions (Addendum)
Follow-up tomorrow morning at 7 AM at the behavioral health urgent care for the appointment for follow-up care and treatment as planned.

## 2020-09-15 NOTE — Progress Notes (Signed)
Patient has been faxed out due to [facility] being under COVID restrictions. Patient meets inpatient criteria per Darrol Angel ,NP. Patient referred to the following facilities:  [facility]  [address] [place] [address] [phone] [place]  [address], [place] [address] [phone]  [facility]  [address], [place] [address] [phone] [place]  [address] [place] [address] [phone] [facility]  [address], [place] [address] [phone] [facility]  [place], [place] [address] [phone]  [facility]  [address] [place], [place] [address] [phone]  [facility]  [phone] [address] [place] [address] [phone] [facility]  [address] [place] [address] [phone] [facility]  [address] [place] [address] [phone]  [facility]  [address], [place] [address] [place]  [facility]  [address] [place] [address] [phone]     CSW will continue to monitor disposition.    Mariea Clonts, MSW, LCSW-A  11:09 AM 09/15/2020

## 2020-09-15 NOTE — ED Notes (Signed)
Mom, Moishe Spice updated.

## 2020-09-15 NOTE — Discharge Planning (Signed)
RNCM consulted regarding medication assistance of pt unable to afford medications.  RNCM utilized [facility] petty cash to cover $10.00 co-pay and filled through [facility].  Rx e-scribed to [facility] and delivered to pt prior to discharge home.  No further RNCM needs identified at this time.

## 2020-09-15 NOTE — Consult Note (Signed)
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 female patient [facility] to [facility] sent from [facility] where patient presented voluntarily as a walk in accompanied by her aunt with complaints of paranoia and hallucinations.  HPI:  Stephanie Golden, 35 y.o., female patient seen via tele health by this provider, consulted with Dr. Hampton Abbot; and chart reviewed on 09/15/20.  On evaluation MAKHYA TUMBARELLO reports patient reports was brought to the hospital because she started hearing voices.  Patient states she was admitted to the hospital once before for the same thing and was started on the medication Zyprexa.  Patient states she started feeling better and stopped taking the medication but then the voices came back."I was taking the medicine and then I started feeling better so I stopped.  I started working and I freaked out one day at work and the voices came back and I've been taking NoDoz to help with sleep."  Patient stating she works swing shifts at work an it has been difficult to stay awake; then the voices are making it difficult to sleep.  Patient denies suicidal/self-harm/homicidal ideation and paranoia.  States the voices are less today but wants to get prescription for Zoloft.  Discussed with patient that she needed to take the medicine Zyprexa everyday even if the voices went away.  The medicine would help keep voices away.  Also discussed starting Trazodone to help with sleep only if needed and to stop taking the NoDoz the increase caffeine could  cause voices to worsen.  Understanding voiced.  Patient doesn't currently have an outpatient psychiatric provider.  Referred to [facility] and has an appointment tomorrow morning.  Patient states that if she was unable to get an appointment by Thursday didn't know when she would be able to go related going back to work on Thursday and not knowing her schedule.  Patient stating she is feeling better and wants to go home with her children, and to prepare for work on Thursday.   During evaluation Stephanie Golden is sitting up in bed in no acute distress.  She is alert, oriented x 4, calm and cooperative.  Her mood is anxious but euthymic with congruent affect.  She reports she continues to have auditory hallucinations but there is an improvement; not as loud, and less.  She does not appear to be experiencing delusional thinking.  Patient denies suicidal/self-harm/homicidal ideation, and paranoia.  Patient answered question appropriately.  Patient has an appointment with [facility] tomorrow at 7:30 AM  Past Psychiatric History: See above  Risk to Self:  Denies Risk to Others:  Denies Prior Inpatient Therapy:  Denies Prior Outpatient Therapy:  Denies  Past Medical History:  Past Medical History:  Diagnosis Date   H/O candidiasis 02/22/11   yeast   H/O rubella 02/22/11   yeast   H/O varicella    No pertinent past medical history     Past Surgical History:  Procedure Laterality Date   CESAREAN SECTION  08/07/2011   Procedure: CESAREAN SECTION;  Surgeon: Ena Dawley, MD;  Location: [facility];  Service:  Gynecology;  Laterality: N/A;  Primary cesarean section with delivery of baby girl at 39. Apgars 9/9.   stitches to r eye     Family History:  Family History  Problem Relation Age of Onset   Drug abuse Father    Diabetes Maternal Uncle    Hypertension Maternal Uncle    Cancer Paternal Aunt    Cancer Maternal Grandmother    Heart disease Paternal Grandmother    Hypertension Paternal Grandmother     Diabetes Paternal Grandmother    Hypertension Maternal Grandfather    Anesthesia problems Neg Hx    Hypotension Neg Hx    Malignant hyperthermia Neg Hx    Pseudochol deficiency Neg Hx    Family Psychiatric  History: See above Social History:  Social History   Substance and Sexual Activity  Alcohol Use No     Social History   Substance and Sexual Activity  Drug Use No    Social History   Socioeconomic History   Marital status: Single    Spouse name: Not on file   Number of children: Not on file   Years of education: Not on file   Highest education level: Not on file  Occupational History   Not on file  Tobacco Use   Smoking status: Former    Packs/day: 0.25    Types: Cigarettes   Smokeless tobacco: Never  Substance and Sexual Activity   Alcohol use: No   Drug use: No   Sexual activity: Yes    Birth control/protection: None  Other Topics Concern   Not on file  Social History Narrative   Not on file   Social Determinants of Health   Financial Resource Strain: Not on file  Food Insecurity: Not on file  Transportation Needs: Not on file  Physical Activity: Not on file  Stress: Not on file  Social Connections: Not on file   Additional Social History:    Allergies:  No Known Allergies  Labs:  Results for orders placed or performed during the hospital encounter of 09/13/20 (from the past 48 hour(s))  Comprehensive metabolic panel     Status: Abnormal   Collection Time: 09/13/20  4:30 PM  Result Value Ref Range   Sodium 136 135 - 145 mmol/L   Potassium 3.4 (L) 3.5 - 5.1 mmol/L   Chloride 105 98 - 111 mmol/L   CO2 22 22 - 32 mmol/L   Glucose, Bld 82 70 - 99 mg/dL    Comment: Glucose reference range applies only to samples taken after fasting for at least 8 hours.   BUN 6 6 - 20 mg/dL   Creatinine, Ser 0.60 0.44 - 1.00 mg/dL   Calcium 9.4 8.9 - 10.3 mg/dL   Total Protein 7.5 6.5 - 8.1 g/dL   Albumin 4.2 3.5 - 5.0 g/dL   AST 19 15 - 41 U/L   ALT 14 0 -  44 U/L   Alkaline Phosphatase 47 38 - 126 U/L   Total Bilirubin 0.8 0.3 - 1.2 mg/dL   GFR, Estimated >60 >60 mL/min    Comment: (NOTE) Calculated using the CKD-EPI Creatinine Equation (2021)    Anion gap 9 5 - 15    Comment: Performed at [facility] [address] [place], [place] [address]  Ethanol     Status: None   Collection Time: 09/13/20  4:30 PM  Result Value Ref Range   Alcohol, Ethyl (B) <10 <10 mg/dL    Comment: (NOTE) Lowest detectable limit  for serum alcohol is 10 mg/dL.  For medical purposes only. Performed at [facility], [place] [address], [place], [place] [address]   Urine rapid drug screen (hosp performed)     Status: None   Collection Time: 09/13/20  4:30 PM  Result Value Ref Range   Opiates NONE DETECTED NONE DETECTED   Cocaine NONE DETECTED NONE DETECTED   Benzodiazepines NONE DETECTED NONE DETECTED   Amphetamines NONE DETECTED NONE DETECTED   Tetrahydrocannabinol NONE DETECTED NONE DETECTED   Barbiturates NONE DETECTED NONE DETECTED    Comment: (NOTE) DRUG SCREEN FOR MEDICAL PURPOSES ONLY.  IF CONFIRMATION IS NEEDED FOR ANY PURPOSE, NOTIFY LAB WITHIN 5 DAYS.  LOWEST DETECTABLE LIMITS FOR URINE DRUG SCREEN Drug Class                     Cutoff (ng/mL) Amphetamine and metabolites    1000 Barbiturate and metabolites    200 Benzodiazepine                 [id number] Tricyclics and metabolites     300 Opiates and metabolites        300 Cocaine and metabolites        300 THC                            50 Performed at [facility], [place] [address] [place], [place] [address]   CBC with Diff     Status: None   Collection Time: 09/13/20  4:30 PM  Result Value Ref Range   WBC 7.9 4.0 - 10.5 K/uL   RBC 4.32 3.87 - 5.11 MIL/uL   Hemoglobin 13.0 12.0 - 15.0 g/dL   HCT 38.3 36.0 - 46.0 %   MCV 88.7 80.0 - 100.0 fL   MCH 30.1 26.0 - 34.0 pg   MCHC 33.9 30.0 - 36.0 g/dL   RDW 13.3 11.5 - 15.5 %   Platelets 152 150 - 400 K/uL   nRBC 0.0 0.0 -  0.2 %   Neutrophils Relative % 76 %   Neutro Abs 6.0 1.7 - 7.7 K/uL   Lymphocytes Relative 18 %   Lymphs Abs 1.4 0.7 - 4.0 K/uL   Monocytes Relative 6 %   Monocytes Absolute 0.4 0.1 - 1.0 K/uL   Eosinophils Relative 0 %   Eosinophils Absolute 0.0 0.0 - 0.5 K/uL   Basophils Relative 0 %   Basophils Absolute 0.0 0.0 - 0.1 K/uL   Immature Granulocytes 0 %   Abs Immature Granulocytes 0.01 0.00 - 0.07 K/uL    Comment: Performed at [facility], [address] [place], [place] [id number]  Salicylate level     Status: Abnormal   Collection Time: 09/13/20  4:30 PM  Result Value Ref Range   Salicylate Lvl Q000111Q (L) 7.0 - 30.0 mg/dL    Comment: Performed at [facility] [address], [place], [place] [address]  Acetaminophen level     Status: Abnormal   Collection Time: 09/13/20  4:30 PM  Result Value Ref Range   Acetaminophen (Tylenol), Serum <10 (L) 10 - 30 ug/mL    Comment: Performed at [facility] [address] [place], [place] [address]  I-Stat beta hCG blood, ED     Status: None   Collection Time: 09/13/20  4:58 PM  Result Value Ref Range   I-stat hCG, quantitative <5.0 <5 mIU/mL  Comment 3            Comment:   GEST. AGE      CONC.  (mIU/mL)   <=1 WEEK        5 - 50     2 WEEKS       50 - 500     3 WEEKS       100 - 10,000     4 WEEKS     1,000 - 30,000        FEMALE AND NON-PREGNANT FEMALE:     LESS THAN 5 mIU/mL   Resp Panel by RT-PCR (Flu A&B, Covid) Nasopharyngeal Swab     Status: None   Collection Time: 09/13/20  8:35 PM   Specimen: Nasopharyngeal Swab; Nasopharyngeal(NP) swabs in vial transport medium  Result Value Ref Range   SARS Coronavirus 2 by RT PCR NEGATIVE NEGATIVE    Comment: (NOTE) SARS-CoV-2 target nucleic acids are NOT DETECTED.  The SARS-CoV-2 RNA is generally detectable in upper respiratory specimens during the acute phase of infection. The lowest concentration of SARS-CoV-2 viral copies this assay can detect is 138 copies/mL. A  negative result does not preclude SARS-Cov-2 infection and should not be used as the sole basis for treatment or other patient management decisions. A negative result may occur with  improper specimen collection/handling, submission of specimen other than nasopharyngeal swab, presence of viral mutation(s) within the areas targeted by this assay, and inadequate number of viral copies(<138 copies/mL). A negative result must be combined with clinical observations, patient history, and epidemiological information. The expected result is Negative.  Fact Sheet for Patients:  EntrepreneurPulse.com.au  Fact Sheet for Healthcare Providers:  IncredibleEmployment.be  This test is no t yet approved or cleared by the [place] FDA and  has been authorized for detection and/or diagnosis of SARS-CoV-2 by FDA under an Emergency Use Authorization (EUA). This EUA will remain  in effect (meaning this test can be used) for the duration of the COVID-19 declaration under Section 564(b)(1) of the Act, 21 U.S.C.section 360bbb-3(b)(1), unless the authorization is terminated  or revoked sooner.       Influenza A by PCR NEGATIVE NEGATIVE   Influenza B by PCR NEGATIVE NEGATIVE    Comment: (NOTE) The Xpert Xpress SARS-CoV-2/FLU/RSV plus assay is intended as an aid in the diagnosis of influenza from Nasopharyngeal swab specimens and should not be used as a sole basis for treatment. Nasal washings and aspirates are unacceptable for Xpert Xpress SARS-CoV-2/FLU/RSV testing.  Fact Sheet for Patients: EntrepreneurPulse.com.au  Fact Sheet for Healthcare Providers: IncredibleEmployment.be  This test is not yet approved or cleared by the [place] FDA and has been authorized for detection and/or diagnosis of SARS-CoV-2 by FDA under an Emergency Use Authorization (EUA). This EUA will remain in effect (meaning this test can be used)  for the duration of the COVID-19 declaration under Section 564(b)(1) of the Act, 21 U.S.C. section 360bbb-3(b)(1), unless the authorization is terminated or revoked.  Performed at [facility], [place] [address] [place], [place] [address]     Medications:  Current Facility-Administered Medications  Medication Dose Route Frequency Provider Last Rate Last Admin   acetaminophen (TYLENOL) tablet 650 mg  650 mg Oral Q4H PRN Sherwood Gambler, MD       alum & mag hydroxide-simeth (MAALOX/MYLANTA) 200-200-20 MG/5ML suspension 30 mL  30 mL Oral Q6H PRN Sherwood Gambler, MD       nicotine (NICODERM CQ - dosed in mg/24 hours) patch  21 mg  21 mg Transdermal Daily Sherwood Gambler, MD   21 mg at 09/14/20 1511   [START ON 09/16/2020] OLANZapine (ZYPREXA) tablet 5 mg  5 mg Oral Daily Janeth Terry B, NP       ondansetron (ZOFRAN) tablet 4 mg  4 mg Oral Q8H PRN Sherwood Gambler, MD       traZODone (DESYREL) tablet 50 mg  50 mg Oral QHS PRN Lesha Jager B, NP       Current Outpatient Medications  Medication Sig Dispense Refill   OLANZapine (ZYPREXA) 5 MG tablet Take 1 tablet (5 mg total) by mouth at bedtime. 30 tablet 0    Musculoskeletal: Strength & Muscle Tone: within normal limits Gait & Station: normal Patient leans: N/A   Psychiatric Specialty Exam:  Presentation  General Appearance: Appropriate for Environment; Casual  Eye Contact:Good  Speech:Clear and Coherent; Normal Rate  Speech Volume:Normal  Handedness:Right   Mood and Affect  Mood:Anxious  Affect:Appropriate; Congruent   Thought Process  Thought Processes:Coherent; Goal Directed  Descriptions of Associations:Intact  Orientation:Full (Time, Place and Person)  Thought Content:WDL  History of Schizophrenia/Schizoaffective disorder:No  Duration of Psychotic Symptoms:Greater than six months  Hallucinations:Hallucinations: Auditory Description of Auditory Hallucinations: Patient reports that she is still  hearing voices but they are less.  "Still hearing a little; not bad.  They say hurtful stuff."  Ideas of Reference:None  Suicidal Thoughts:Suicidal Thoughts: No  Homicidal Thoughts:Homicidal Thoughts: No   Sensorium  Memory:Immediate Good; Recent Good  Judgment:Intact  Insight:Present   Executive Functions  Concentration:Fair  Attention Span:Good  [place] of Knowledge:Good  Language:Good   Psychomotor Activity  Psychomotor Activity:Psychomotor Activity: Normal   Assets  Assets:Desire for Improvement; Housing; Resilience; Social Support   Sleep  Sleep:Sleep: Fair    Physical Exam: Physical Exam Vitals and nursing note reviewed. Exam conducted with a chaperone present.  Constitutional:      General: She is not in acute distress.    Appearance: Normal appearance. She is not ill-appearing.  Cardiovascular:     Rate and Rhythm: Normal rate.  Pulmonary:     Effort: Pulmonary effort is normal.  Neurological:     Mental Status: She is alert and oriented to person, place, and time.  Psychiatric:        Attention and Perception: Attention normal. She perceives auditory hallucinations.        Mood and Affect: Mood and affect normal.        Speech: Speech normal.        Behavior: Behavior normal. Behavior is cooperative.        Thought Content: Thought content is not paranoid or delusional. Thought content does not include homicidal or suicidal ideation.        Cognition and Memory: Cognition and memory normal.        Judgment: Judgment normal.   Review of Systems  Constitutional: Negative.   HENT: Negative.    Eyes: Negative.   Respiratory: Negative.    Cardiovascular: Negative.   Gastrointestinal: Negative.   Genitourinary: Negative.   Musculoskeletal: Negative.   Skin: Negative.   Neurological: Negative.   Endo/Heme/Allergies: Negative.   Psychiatric/Behavioral:  Depression: Stable. Hallucinations: States she continues to hear voices but  they are less, not as loud.  Better. Substance abuse: Denies. Suicidal ideas: Denies. Insomnia: States works swing shift and has difficulty sleeping and staying awake when shift changes.        Patient stating she is feeling better but would like to  be set up with outpatient psychiatric services so she can continue her medication.   Blood pressure 110/74, pulse 88, temperature 97.7 F (36.5 C), resp. rate 14, last menstrual period 09/10/2020, SpO2 97 %. There is no height or weight on file to calculate BMI.  Treatment Plan Summary: Plan Psychiatrically clear to follow up with [facility] up.   Specialty: Urgent Care Why: You have an appointment on 09/16/20 at 7:30 am (in-person). Please arrive 15 minutes early for paperwork. Contact information: [place] [id number] [phone]                 Disposition:  Psychiatrically cleared No evidence of imminent risk to self or others at present.   Patient does not meet criteria for psychiatric inpatient admission. Supportive therapy provided about ongoing stressors. Discussed crisis plan, support from social network, calling 911, coming to the Emergency Department, and calling Suicide Hotline.  This service was provided via telemedicine using a 2-way, interactive audio and video technology.  Names of all persons participating in this telemedicine service and their role in this encounter. Name: Earleen Newport Role: NP  Name: Dr. Hampton Abbot Role: Psychiatrist  Name: Veleta Miners Role: Patient  Name:  Role:    Secure message sent to patient's nurse Blima Singer Pflueger, RN informing:  Psychiatric consult complete.  Psychiatrically cleared.  Patient Has a scheduled appointment with [facility] tomorrow 09/16/20 at 7::00 AM for intake.  Please advise patient not to miss this appointment it is for medication management.  Patient may need a  prescription for one week of Zyprexa 5 mg daily and Trazodone 50 mg Q hs prn sleep just in case.  Inform MD only default listed.   Rodrickus Min, NP 09/15/2020 1:14 PM

## 2020-09-16 ENCOUNTER — Encounter (HOSPITAL_COMMUNITY): Payer: Self-pay | Admitting: Physician Assistant

## 2020-09-16 ENCOUNTER — Other Ambulatory Visit: Payer: Self-pay

## 2020-09-16 ENCOUNTER — Ambulatory Visit (INDEPENDENT_AMBULATORY_CARE_PROVIDER_SITE_OTHER): Payer: No Payment, Other | Admitting: Physician Assistant

## 2020-09-16 VITALS — BP 82/59 | HR 74 | Ht 63.0 in | Wt 132.0 lb

## 2020-09-16 DIAGNOSIS — F29 Unspecified psychosis not due to a substance or known physiological condition: Secondary | ICD-10-CM | POA: Diagnosis not present

## 2020-09-16 DIAGNOSIS — F99 Mental disorder, not otherwise specified: Secondary | ICD-10-CM | POA: Insufficient documentation

## 2020-09-16 DIAGNOSIS — F5105 Insomnia due to other mental disorder: Secondary | ICD-10-CM

## 2020-09-16 MED ORDER — TRAZODONE HCL 50 MG PO TABS
50.0000 mg | ORAL_TABLET | Freq: Every day | ORAL | 1 refills | Status: DC
  Filled 2020-09-16 (×2): qty 30, 30d supply, fill #0

## 2020-09-16 MED ORDER — OLANZAPINE 5 MG PO TABS
5.0000 mg | ORAL_TABLET | Freq: Every day | ORAL | 1 refills | Status: DC
  Filled 2020-09-16 (×2): qty 30, 30d supply, fill #0

## 2020-09-16 NOTE — Progress Notes (Signed)
Psychiatric Initial Adult Assessment   Patient Identification: Stephanie Golden MRN:  [id number] Date of Evaluation:  09/16/2020 Referral Source: [facility] Chief Complaint:   Chief Complaint   Stress    Visit Diagnosis:    ICD-10-CM   1. Psychotic disorder with hallucinations (HCC)  F29 OLANZapine (ZYPREXA) 5 MG tablet    2. Insomnia due to other mental disorder  F51.05 OLANZapine (ZYPREXA) 5 MG tablet   F99 traZODone (DESYREL) 50 MG tablet      History of Present Illness:    Tiarna L. Thrun  Associated Signs/Symptoms: Depression Symptoms:  depressed mood, insomnia, psychomotor agitation, psychomotor retardation, fatigue, feelings of worthlessness/guilt, difficulty concentrating, hopelessness, anxiety, panic attacks, loss of energy/fatigue, disturbed sleep, increased appetite, (Hypo) Manic Symptoms:  Delusions, Elevated Mood, Flight of Ideas, Grandiosity, Hallucinations, Impulsivity, Irritable Mood, Labiality of Mood, Anxiety Symptoms:  Agoraphobia, Excessive Worry, Panic Symptoms, Social Anxiety, Specific Phobias, Psychotic Symptoms:  Delusions, Hallucinations: Command:  Different voices telling her to harm people or let people harm her. The voices have also told her to harm self. Ideas of Reference, Paranoia, PTSD Symptoms: Had a traumatic exposure:  Patient states that she wasn't so trusting with her mother due as a kid. Patient states that her mother was strict and was known to receive corporal punishment. Patient states that her father passed away from lung cancer  3 years ago. People have cast judgement towards the patient because she smokes and they think she might abuse substances. Had a traumatic exposure in the last month:  Work has been overwhelming for her recently. Re-experiencing:  Flashbacks Intrusive Thoughts Hypervigilance:  Yes Hyperarousal:  Difficulty Concentrating Emotional Numbness/Detachment Irritability/Anger Sleep Avoidance:   Decreased Interest/Participation Foreshortened Future  Past Psychiatric History:  Patient is unsure of psychiatric diagnosis  Previous Psychotropic Medications: Yes   Substance Abuse History in the last 12 months:  Yes.    Consequences of Substance Abuse: Medical Consequences:  None Legal Consequences:  None Family Consequences:  None Blackouts:  None DT's: N/A Withdrawal Symptoms:   None  Past Medical History:  Past Medical History:  Diagnosis Date   H/O candidiasis 02/22/11   yeast   H/O rubella 02/22/11   yeast   H/O varicella    No pertinent past medical history     Past Surgical History:  Procedure Laterality Date   CESAREAN SECTION  08/07/2011   Procedure: CESAREAN SECTION;  Surgeon: Ena Dawley, MD;  Location: [facility];  Service: Gynecology;  Laterality: N/A;  Primary cesarean section with delivery of baby girl at 25. Apgars 9/9.   stitches to r eye      Family Psychiatric History:  Uncle (maternal) - patient states that her   Family History:  Family History  Problem Relation Age of Onset   Drug abuse Father    Diabetes Maternal Uncle    Hypertension Maternal Uncle    Cancer Paternal Aunt    Cancer Maternal Grandmother    Heart disease Paternal Grandmother    Hypertension Paternal Grandmother    Diabetes Paternal Grandmother    Hypertension Maternal Grandfather    Anesthesia problems Neg Hx    Hypotension Neg Hx    Malignant hyperthermia Neg Hx    Pseudochol deficiency Neg Hx     Social History:   Social History   Socioeconomic History   Marital status: Single    Spouse name: Not on file   Number of children: Not on file   Years of education: Not on file  Highest education level: Not on file  Occupational History   Not on file  Tobacco Use   Smoking status: Former    Packs/day: 0.25    Types: Cigarettes   Smokeless tobacco: Never  Substance and Sexual Activity   Alcohol use: No   Drug use: No   Sexual activity: Yes    Birth  control/protection: None  Other Topics Concern   Not on file  Social History Narrative   Not on file   Social Determinants of Health   Financial Resource Strain: Not on file  Food Insecurity: Not on file  Transportation Needs: Not on file  Physical Activity: Not on file  Stress: Not on file  Social Connections: Not on file    Additional Social History:  Patient and her daughter are currently living with her mother. Patient is currently enrolled in [employer] and looking into acquiring a temp position. 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 Patient endorses social support.  Allergies:  No Known Allergies  Metabolic Disorder Labs: No results found for: HGBA1C, MPG No results found for: PROLACTIN No results found for: CHOL, TRIG, HDL, CHOLHDL, VLDL, LDLCALC No results found for: TSH  Therapeutic Level Labs: No results found for: LITHIUM No results found for: CBMZ No results found for: VALPROATE  Current Medications: Current Outpatient Medications  Medication Sig Dispense Refill   OLANZapine (ZYPREXA) 5 MG tablet Take 1 tablet (5 mg total) by mouth at bedtime. 30 tablet 1   traZODone (DESYREL) 50 MG tablet Take 1 tablet (50 mg total) by mouth at bedtime. 30 tablet 1   No current facility-administered medications for this visit.    Musculoskeletal: Strength & Muscle Tone: within normal limits Gait & Station: normal Patient leans: N/A  Psychiatric Specialty Exam: Review of Systems  Psychiatric/Behavioral:  Positive for behavioral problems, decreased concentration and sleep disturbance. Negative for dysphoric mood, hallucinations, self-injury and suicidal ideas. The patient is nervous/anxious. The patient is not hyperactive.    Blood pressure (!) 82/59, pulse 74, height '5\' 3"'$  (1.6 m), weight 132 lb (59.9 kg), last menstrual period 09/10/2020.Body mass index is 23.38 kg/m.  General Appearance: Well  Groomed  Eye Contact:  Good  Speech:  Clear and Coherent and Normal Rate  Volume:  Normal  Mood:  Anxious and Depressed  Affect:  Congruent and Depressed  Thought Process:  Coherent, Goal Directed, and Descriptions of Associations: Intact  Orientation:  Full (Time, Place, and Person)  Thought Content:  WDL, Hallucinations: Auditory Command:  Patient's command type hallucinations are characterized by negative thoughts that direct insults at her as well as voices telling her to do things, and Paranoid Ideation  Suicidal Thoughts:  No  Homicidal Thoughts:  No  Memory:  Immediate;   Good Recent;   Good Remote;   Fair  Judgement:  Good  Insight:  Good  Psychomotor Activity:  Normal  Concentration:  Concentration: Good and Attention Span: Good  Recall:  Good  Fund of Knowledge:Good  Language: Good  Akathisia:  NA  Handed:  Right  AIMS (if indicated):  not done  Assets:  Communication Skills Desire for Improvement Housing Social Support Vocational/Educational  ADL's:  Intact  Cognition: WNL  Sleep:  Fair   Screenings: GAD-7    Flowsheet [place] Office Visit from 09/16/2020 in [facility]  Total GAD-7 Score 14      PHQ2-9    [place] Office Visit from 09/16/2020 in  [facility] ED from 09/13/2020 in [facility]  PHQ-2 Total Score 4 2  PHQ-9 Total Score 16 8      [place] Office Visit from 09/16/2020 in [facility] ED from 09/13/2020 in [place] ED from 08/01/2020 in [place] No Risk Moderate Risk No Risk       Assessment and Plan:     1. Psychotic disorder with hallucinations (HCC)  - OLANZapine (ZYPREXA) 5 MG tablet; Take 1 tablet (5 mg total) by mouth at bedtime.  Dispense: 30 tablet; Refill: 1  2. Insomnia due to other mental disorder  -  OLANZapine (ZYPREXA) 5 MG tablet; Take 1 tablet (5 mg total) by mouth at bedtime.  Dispense: 30 tablet; Refill: 1 - traZODone (DESYREL) 50 MG tablet; Take 1 tablet (50 mg total) by mouth at bedtime.  Dispense: 30 tablet; Refill: 1  Patient to follow up in 6 weeks Provider spent a total of 50 minutes with the patient/reviewing patient's chart  Malachy Mood, PA 9/7/20229:41 AM

## 2020-09-17 ENCOUNTER — Other Ambulatory Visit: Payer: Self-pay

## 2020-09-17 ENCOUNTER — Encounter (HOSPITAL_COMMUNITY): Payer: Self-pay | Admitting: Physician Assistant

## 2020-09-21 ENCOUNTER — Other Ambulatory Visit: Payer: Self-pay

## 2020-10-28 ENCOUNTER — Other Ambulatory Visit: Payer: Self-pay

## 2020-10-28 ENCOUNTER — Ambulatory Visit (INDEPENDENT_AMBULATORY_CARE_PROVIDER_SITE_OTHER): Payer: No Payment, Other | Admitting: Physician Assistant

## 2020-10-28 ENCOUNTER — Encounter (HOSPITAL_COMMUNITY): Payer: Self-pay | Admitting: Physician Assistant

## 2020-10-28 DIAGNOSIS — F5105 Insomnia due to other mental disorder: Secondary | ICD-10-CM | POA: Diagnosis not present

## 2020-10-28 DIAGNOSIS — F29 Unspecified psychosis not due to a substance or known physiological condition: Secondary | ICD-10-CM | POA: Diagnosis not present

## 2020-10-28 DIAGNOSIS — F99 Mental disorder, not otherwise specified: Secondary | ICD-10-CM | POA: Diagnosis not present

## 2020-10-28 MED ORDER — OLANZAPINE 5 MG PO TABS
5.0000 mg | ORAL_TABLET | Freq: Every day | ORAL | 1 refills | Status: DC
  Filled 2020-10-28: qty 30, 30d supply, fill #0
  Filled 2020-12-10: qty 30, 30d supply, fill #1

## 2020-10-28 MED ORDER — TRAZODONE HCL 50 MG PO TABS
50.0000 mg | ORAL_TABLET | Freq: Every day | ORAL | 1 refills | Status: DC
  Filled 2020-10-28: qty 30, 30d supply, fill #0
  Filled 2020-12-10: qty 30, 30d supply, fill #1

## 2020-10-28 NOTE — Progress Notes (Addendum)
[facility] MD/PA/NP OP Progress Note  10/28/2020 12:58 PM FALYN RUBEL  MRN:  [id number]  Chief Complaint:  Chief Complaint   Medication Management    HPI:   Stephanie Golden is a 35 year old female with a past psychiatric history significant for psychotic disorder with hallucinations and insomnia who presents to [facility] for follow-up and medication management.  Patient is currently being managed on the following medications:  Olanzapine 5 mg at bedtime Trazodone 50 mg at bedtime  Patient reports that her medications have been very helpful.  Patient states that she is not hearing voices as frequently and whenever she does hear voices they are not negative in nature.  Whenever she would hear the voices, she said she felt like someone was speaking to her.  She states that her voices triggered her about her aunt due to her aunt's past history with drug abuse.  Patient states that she will occasionally feel like a "crack demon" is influencing her and preventing her from being able to move forward from her father's passing.  Patient reports that her father used to be on drugs but passed away from cancer roughly 4 years ago.  Since taking her medications, patient states that her inner thoughts are more positive and that they push her to do better.  Patient states that it is sometimes hard for her to do work as well as go to school.  Patient states that she may file for disability.  Patient states that she currently feels down for not being on her own.  Patient is currently living with her mother and wishes she was in a place of her own that was not as cramped.  Patient endorses anxiety she rates a 7 out of 10.  Patient denies any new stressors at this time.  A PHQ-9 screen was performed with the patient scoring an 11.  A GAD-7 screen was also performed with the patient scoring a 10.  Patient is alert and oriented x4, pleasant, calm, cooperative, and fully engaged  in conversation during the encounter.  Patient endorses okay mood but still feels a little anxious.  Patient denies active suicidal ideations but states that she has fleeting thoughts from time to time.  Patient denies homicidal ideations.  She further denies active auditory or visual hallucinations and does not appear to be responding to internal/external stimuli.  Patient endorses fair sleep and receives on average 5 to 6 hours of sleep each night.  Patient endorses fair appetite and eats on average 1-2 meals per day.  Patient endorses alcohol consumption occasionally.  Patient endorses tobacco use and smokes on occasion Black and Milds.  Patient denies illicit drug use.  Visit Diagnosis:    ICD-10-CM   1. Psychotic disorder with hallucinations (HCC)  F29 OLANZapine (ZYPREXA) 5 MG tablet    2. Insomnia due to other mental disorder  F51.05 OLANZapine (ZYPREXA) 5 MG tablet   F99 traZODone (DESYREL) 50 MG tablet      Past Psychiatric History:  Psychotic disorder with hallucinations Insomnia  Past Medical History:  Past Medical History:  Diagnosis Date   H/O candidiasis 02/22/11   yeast   H/O rubella 02/22/11   yeast   H/O varicella    No pertinent past medical history     Past Surgical History:  Procedure Laterality Date   CESAREAN SECTION  08/07/2011   Procedure: CESAREAN SECTION;  Surgeon: Ena Dawley, MD;  Location: [facility];  Service: Gynecology;  Laterality: N/A;  Primary cesarean section with delivery of baby girl at 17. Apgars 9/9.   stitches to r eye      Family Psychiatric History:  Uncle (maternal) - patient states that her uncle has schizophrenia possibly  Family History:  Family History  Problem Relation Age of Onset   Drug abuse Father    Diabetes Maternal Uncle    Hypertension Maternal Uncle    Cancer Paternal Aunt    Cancer Maternal Grandmother    Heart disease Paternal Grandmother    Hypertension Paternal Grandmother    Diabetes Paternal Grandmother     Hypertension Maternal Grandfather    Anesthesia problems Neg Hx    Hypotension Neg Hx    Malignant hyperthermia Neg Hx    Pseudochol deficiency Neg Hx     Social History:  Social History   Socioeconomic History   Marital status: Single    Spouse name: Not on file   Number of children: Not on file   Years of education: Not on file   Highest education level: Not on file  Occupational History   Not on file  Tobacco Use   Smoking status: Former    Packs/day: 0.25    Types: Cigarettes   Smokeless tobacco: Never  Substance and Sexual Activity   Alcohol use: No   Drug use: No   Sexual activity: Yes    Birth control/protection: None  Other Topics Concern   Not on file  Social History Narrative   Not on file   Social Determinants of Health   Financial Resource Strain: Not on file  Food Insecurity: Not on file  Transportation Needs: Not on file  Physical Activity: Not on file  Stress: Not on file  Social Connections: Not on file    Allergies: No Known Allergies  Metabolic Disorder Labs: No results found for: HGBA1C, MPG No results found for: PROLACTIN No results found for: CHOL, TRIG, HDL, CHOLHDL, VLDL, LDLCALC No results found for: TSH  Therapeutic Level Labs: No results found for: LITHIUM No results found for: VALPROATE No components found for:  CBMZ  Current Medications: Current Outpatient Medications  Medication Sig Dispense Refill   OLANZapine (ZYPREXA) 5 MG tablet Take 1 tablet (5 mg total) by mouth at bedtime. 30 tablet 1   traZODone (DESYREL) 50 MG tablet Take 1 tablet (50 mg total) by mouth at bedtime. 30 tablet 1   No current facility-administered medications for this visit.     Musculoskeletal: Strength & Muscle Tone: within normal limits Gait & Station: normal Patient leans: N/A  Psychiatric Specialty Exam: Review of Systems  Psychiatric/Behavioral:  Positive for sleep disturbance. Negative for decreased concentration, dysphoric mood,  hallucinations, self-injury and suicidal ideas. The patient is nervous/anxious. The patient is not hyperactive.    Blood pressure 123/67, pulse 67, height 5\' 3"  (1.6 m), weight 150 lb (68 kg).Body mass index is 26.57 kg/m.  General Appearance: Well Groomed  Eye Contact:  Good  Speech:  Clear and Coherent and Normal Rate  Volume:  Normal  Mood:  Anxious and Euthymic  Affect:  Appropriate and Congruent  Thought Process:  Coherent and Descriptions of Associations: Intact  Orientation:  Full (Time, Place, and Person)  Thought Content: WDL   Suicidal Thoughts:  No  Homicidal Thoughts:  No  Memory:  Immediate;   Good Recent;   Good Remote;   Fair  Judgement:  Good  Insight:  Good  Psychomotor Activity:  Normal  Concentration:  Concentration: Good and Attention Span: Good  Recall:  Good  Fund of Knowledge: Good  Language: Good  Akathisia:  NA  Handed:  Right  AIMS (if indicated): not done  Assets:  Communication Skills Desire for Improvement Housing Social Support Vocational/Educational  ADL's:  Intact  Cognition: WNL  Sleep:  Fair   Screenings: GAD-7    Personnel officer Visit from 10/28/2020 in [facility] Office Visit from 09/16/2020 in [facility]  Total GAD-7 Score 10 14      PHQ2-9    [place] Office Visit from 10/28/2020 in [facility] Office Visit from 09/16/2020 in [facility] ED from 09/13/2020 in [facility]  PHQ-2 Total Score 2 4 2   PHQ-9 Total Score 11 16 8       [place] Visit from 10/28/2020 in [facility] Office Visit from 09/16/2020 in [facility] ED from 09/13/2020 in [place] No Risk Moderate Risk        Assessment and Plan:   Gerri L. Chenard is a 35 year old  female with a past psychiatric history significant for psychotic disorder with hallucinations and insomnia who presents to [facility] for follow-up and medication management.  Patient reports that she has been doing well since taking her medications.  Patient states that she is experiencing less voices and that most of her inner thoughts tend to be positive in nature.  The only issue patient has regarding her medication is the weight gain that she is experiencing.  Patient agreed to speak with provider on next encounter about other options for managing her symptoms while not adding on weight.  Patient to leave medications as is.  Patient is requesting refills on her medications following the conclusion of the encounter.  Patient's medications to be e- prescribed to pharmacy of choice.  1. Psychotic disorder with hallucinations (HCC)  - OLANZapine (ZYPREXA) 5 MG tablet; Take 1 tablet (5 mg total) by mouth at bedtime.  Dispense: 30 tablet; Refill: 1  2. Insomnia due to other mental disorder  - OLANZapine (ZYPREXA) 5 MG tablet; Take 1 tablet (5 mg total) by mouth at bedtime.  Dispense: 30 tablet; Refill: 1 - traZODone (DESYREL) 50 MG tablet; Take 1 tablet (50 mg total) by mouth at bedtime.  Dispense: 30 tablet; Refill: 1  Patient to follow up in 2 months Provider spent a total of 40 minutes with the patient/reviewing patient's chart  Malachy Mood, PA 10/28/2020, 12:58 PM

## 2020-10-29 ENCOUNTER — Other Ambulatory Visit: Payer: Self-pay

## 2020-10-30 ENCOUNTER — Other Ambulatory Visit: Payer: Self-pay

## 2020-11-04 ENCOUNTER — Ambulatory Visit: Payer: Self-pay | Attending: Family

## 2020-11-04 DIAGNOSIS — Z23 Encounter for immunization: Secondary | ICD-10-CM

## 2020-11-04 NOTE — Progress Notes (Signed)
   Covid-19 Vaccination Clinic  Name:  Stephanie Golden    MRN: [id number] DOB: 1985-12-18  11/04/2020  Stephanie Golden was observed post Covid-19 immunization for 15 minutes without incident. She was provided with Vaccine Information Sheet and instruction to access the V-Safe system.   Stephanie Golden was instructed to call 911 with any severe reactions post vaccine: Difficulty breathing  Swelling of face and throat  A fast heartbeat  A bad rash all over body  Dizziness and weakness   Immunizations Administered     Name Date Dose VIS Date Route   Moderna COVID-19 Vaccine 11/04/2020  2:00 PM 0.5 mL 10/30/2019 Intramuscular   Manufacturer: Moderna   Lot: [id number]   [place]: [id number]

## 2020-12-10 ENCOUNTER — Other Ambulatory Visit: Payer: Self-pay

## 2020-12-11 ENCOUNTER — Other Ambulatory Visit: Payer: Self-pay

## 2020-12-14 ENCOUNTER — Other Ambulatory Visit: Payer: Self-pay

## 2020-12-15 ENCOUNTER — Other Ambulatory Visit: Payer: Self-pay

## 2020-12-30 ENCOUNTER — Other Ambulatory Visit: Payer: Self-pay

## 2020-12-30 ENCOUNTER — Encounter (HOSPITAL_COMMUNITY): Payer: Self-pay | Admitting: Physician Assistant

## 2020-12-30 ENCOUNTER — Ambulatory Visit (INDEPENDENT_AMBULATORY_CARE_PROVIDER_SITE_OTHER): Payer: No Payment, Other | Admitting: Physician Assistant

## 2020-12-30 VITALS — BP 132/80 | HR 86 | Temp 98.8°F | Ht 63.0 in | Wt 167.4 lb

## 2020-12-30 DIAGNOSIS — F99 Mental disorder, not otherwise specified: Secondary | ICD-10-CM | POA: Diagnosis not present

## 2020-12-30 DIAGNOSIS — F5105 Insomnia due to other mental disorder: Secondary | ICD-10-CM

## 2020-12-30 DIAGNOSIS — F29 Unspecified psychosis not due to a substance or known physiological condition: Secondary | ICD-10-CM

## 2020-12-30 MED ORDER — LYBALVI 5-10 MG PO TABS
5.0000 mg | ORAL_TABLET | Freq: Every day | ORAL | 1 refills | Status: DC
  Filled 2020-12-30 – 2021-01-18 (×2): qty 30, 30d supply, fill #0

## 2020-12-30 MED ORDER — TRAZODONE HCL 50 MG PO TABS
50.0000 mg | ORAL_TABLET | Freq: Every day | ORAL | 1 refills | Status: DC
  Filled 2020-12-30 – 2021-01-18 (×2): qty 30, 30d supply, fill #0

## 2020-12-30 MED ORDER — LYBALVI 5-10 MG PO TABS: 5.0000 mg | ORAL_TABLET | Freq: Every day | ORAL | 0 refills | Status: DC

## 2020-12-30 NOTE — Progress Notes (Signed)
[place] MD/PA/NP OP Progress Note  12/30/2020 8:23 PM Stephanie Golden  MRN:  [id number]  Chief Complaint: Follow up and medication management  HPI:   Stephanie Golden is a 35 year old female with a past psychiatric history significant for psychotic disorder with hallucinations and insomnia who presents to [facility] for follow-up and medication management.  Patient is currently being managed on the following medications:  Olanzapine 5 mg at bedtime Trazodone 50 mg at bedtime  Patient reports that she has gained considerable weight while taking olanzapine.  She does report that her olanzapine has been helpful in limiting her manic symptoms.  Patient endorses some mild depression due to graduate school being difficult.  Patient endorses anxiety she rates a 6 out of 10.  What makes the patient most anxious is thinking about possibly not completing her education.  Patient stressors include school and thinking about expenses such as her car.  Patient states that she occasionally experiences paranoia about hypothetical situations that could happen to her such as being targeted or hurt.  A PHQ-9 screen was performed with the patient scoring a 10.  A GAD-7 screen was also performed with the patient scoring a 10.  Patient is alert and oriented x4, calm, cooperative, and fully engaged in conversation during the encounter.  Patient endorses being in a jolly mood.  Patient denies suicidal or homicidal ideations.  She further denies auditory or visual hallucinations and does not appear to be responding to internal/external stimuli.  Patient endorses fair sleep and receives on average 4 to 6 hours of sleep each night.  Patient endorses decreased appetite but states that she has at least 2 meals per day.  She also endorses snacking a lot.  Patient endorses alcohol consumption.  She further endorses tobacco use stating that she smokes roughly 2 Black and Mild cigars every day.   Patient denies illicit drug use.  Visit Diagnosis:    ICD-10-CM   1. Psychotic disorder with hallucinations (HCC)  F29 OLANZapine-Samidorphan (LYBALVI) 5-10 MG TABS    2. Insomnia due to other mental disorder  F51.05 traZODone (DESYREL) 50 MG tablet   F99       Past Psychiatric History:  Psychotic disorder with hallucinations Insomnia  Past Medical History:  Past Medical History:  Diagnosis Date   H/O candidiasis 02/22/11   yeast   H/O rubella 02/22/11   yeast   H/O varicella    No pertinent past medical history     Past Surgical History:  Procedure Laterality Date   CESAREAN SECTION  08/07/2011   Procedure: CESAREAN SECTION;  Surgeon: Ena Dawley, MD;  Location: [facility];  Service: Gynecology;  Laterality: N/A;  Primary cesarean section with delivery of baby girl at 69. Apgars 9/9.   stitches to r eye      Family Psychiatric History:  Uncle (maternal) - patient states that her uncle has schizophrenia possibly  Family History:  Family History  Problem Relation Age of Onset   Drug abuse Father    Diabetes Maternal Uncle    Hypertension Maternal Uncle    Cancer Paternal Aunt    Cancer Maternal Grandmother    Heart disease Paternal Grandmother    Hypertension Paternal Grandmother    Diabetes Paternal Grandmother    Hypertension Maternal Grandfather    Anesthesia problems Neg Hx    Hypotension Neg Hx    Malignant hyperthermia Neg Hx    Pseudochol deficiency Neg Hx     Social History:  Social History   Socioeconomic History   Marital status: Single    Spouse name: Not on file   Number of children: Not on file   Years of education: Not on file   Highest education level: Not on file  Occupational History   Not on file  Tobacco Use   Smoking status: Former    Packs/day: 0.25    Types: Cigarettes   Smokeless tobacco: Never  Substance and Sexual Activity   Alcohol use: No   Drug use: No   Sexual activity: Yes    Birth control/protection: None  Other  Topics Concern   Not on file  Social History Narrative   Not on file   Social Determinants of Health   Financial Resource Strain: Not on file  Food Insecurity: Not on file  Transportation Needs: Not on file  Physical Activity: Not on file  Stress: Not on file  Social Connections: Not on file    Allergies: No Known Allergies  Metabolic Disorder Labs: No results found for: HGBA1C, MPG No results found for: PROLACTIN No results found for: CHOL, TRIG, HDL, CHOLHDL, VLDL, LDLCALC No results found for: TSH  Therapeutic Level Labs: No results found for: LITHIUM No results found for: VALPROATE No components found for:  CBMZ  Current Medications: Current Outpatient Medications  Medication Sig Dispense Refill   OLANZapine-Samidorphan (LYBALVI) 5-10 MG TABS Take 5 mg by mouth daily. 28 tablet 0   OLANZapine-Samidorphan (LYBALVI) 5-10 MG TABS Take 5 mg by mouth at bedtime. 30 tablet 1   OLANZapine (ZYPREXA) 5 MG tablet Take 1 tablet (5 mg total) by mouth at bedtime. 30 tablet 1   traZODone (DESYREL) 50 MG tablet Take 1 tablet (50 mg total) by mouth at bedtime. 30 tablet 1   No current facility-administered medications for this visit.     Musculoskeletal: Strength & Muscle Tone: within normal limits Gait & Station: normal Patient leans: N/A  Psychiatric Specialty Exam: Review of Systems  Psychiatric/Behavioral:  Positive for sleep disturbance. Negative for decreased concentration, dysphoric mood, hallucinations, self-injury and suicidal ideas. The patient is nervous/anxious. The patient is not hyperactive.    Blood pressure 132/80, pulse 86, temperature 98.8 F (37.1 C), temperature source Oral, height 5\' 3"  (1.6 m), weight 167 lb 6.4 oz (75.9 kg), SpO2 100 %.Body mass index is 29.65 kg/m.  General Appearance: Well Groomed  Eye Contact:  Good  Speech:  Clear and Coherent and Normal Rate  Volume:  Normal  Mood:  Anxious and Euthymic  Affect:  Appropriate and Congruent   Thought Process:  Coherent, Goal Directed, and Descriptions of Associations: Intact  Orientation:  Full (Time, Place, and Person)  Thought Content: WDL   Suicidal Thoughts:  No  Homicidal Thoughts:  No  Memory:  Immediate;   Good Recent;   Good Remote;   Fair  Judgement:  Good  Insight:  Good  Psychomotor Activity:  Normal  Concentration:  Concentration: Good and Attention Span: Good  Recall:  Good  Fund of Knowledge: Good  Language: Good  Akathisia:  NA  Handed:  Right  AIMS (if indicated): not done  Assets:  Communication Skills Desire for Improvement Housing Social Support Vocational/Educational  ADL's:  Intact  Cognition: WNL  Sleep:  Fair   Screenings: GAD-7    Flowsheet Row Clinical Support from 12/30/2020 in [facility] Office Visit from 10/28/2020 in [facility] Office Visit from 09/16/2020 in [facility]  Total GAD-7  Score 10 10 14       PHQ2-9    Flowsheet Row Clinical Support from 12/30/2020 in [facility] Office Visit from 10/28/2020 in [facility] Office Visit from 09/16/2020 in [facility] ED from 09/13/2020 in [facility]  PHQ-2 Total Score 2 2 4 2   PHQ-9 Total Score 10 11 16 8       [place] from 12/30/2020 in [facility] Office Visit from 10/28/2020 in [facility] Office Visit from 09/16/2020 in [place] No Risk Low Risk No Risk        Assessment and Plan:   Tykeria L. Fornes is a 35 year old female with a past psychiatric history significant for psychotic disorder with hallucinations and insomnia who presents to [facility] for follow-up and medication management.  Patient reports no  issues or concerns regarding the effectiveness of her medications, however, patient is displeased about the weight gain she has experienced.  Provider recommended patient be switched over to the Lybalvi 5 mg at bedtime for the management of her psychotic disorders with hallucinations.  Patient was informed that Lybalvi showed fewer instances of weight gain.  Patient was provided a month supply of medication.  A prescription for Lybalvi was sent over to patient's pharmacy to help set patient up with financial assistance.  Patient is agreeable to recommendations.  Patient's medications to be e-prescribed to pharmacy of choice.  1. Psychotic disorder with hallucinations (HCC)  - OLANZapine-Samidorphan (LYBALVI) 5-10 MG TABS; Take 5 mg by mouth at bedtime.  Dispense: 30 tablet; Refill: 1  2. Insomnia due to other mental disorder  - traZODone (DESYREL) 50 MG tablet; Take 1 tablet (50 mg total) by mouth at bedtime.  Dispense: 30 tablet; Refill: 1  Patient to follow up in 2 months Provider spent a total of 23 minutes with the patient/reviewing patient's chart  Malachy Mood, PA 12/30/2020, 8:23 PM

## 2020-12-31 ENCOUNTER — Other Ambulatory Visit: Payer: Self-pay

## 2020-12-31 ENCOUNTER — Other Ambulatory Visit (HOSPITAL_COMMUNITY): Payer: Self-pay | Admitting: *Deleted

## 2021-01-01 ENCOUNTER — Other Ambulatory Visit: Payer: Self-pay

## 2021-01-07 ENCOUNTER — Other Ambulatory Visit: Payer: Self-pay

## 2021-01-15 ENCOUNTER — Telehealth (HOSPITAL_COMMUNITY): Payer: Self-pay | Admitting: *Deleted

## 2021-01-15 NOTE — Telephone Encounter (Signed)
Called for her medicine, not sure if it was called in. Asked her if she had checked with her pharmacy and she said she did not want to make the drive all the way there if it wasn't called in. I told her I would follow thru with it, She called back immediately asking how I knew how to follow up on it when I didn't not take her name or anything, She provided her name when she called and I told her I would follow up on  it. Said the pharmacy was closed now so she couldn't call. TOday is the first I am hearing from her re a rx. WIll forward concern to [facility] as that is what she said she wanted, to speak with him.

## 2021-01-18 ENCOUNTER — Other Ambulatory Visit: Payer: Self-pay

## 2021-01-19 ENCOUNTER — Other Ambulatory Visit: Payer: Self-pay

## 2021-02-02 ENCOUNTER — Telehealth (HOSPITAL_COMMUNITY): Payer: Self-pay | Admitting: *Deleted

## 2021-02-02 NOTE — Telephone Encounter (Signed)
Call from patient, her future appt is 2/24 and her medicine will be out by the end of January. Will make Eddie PA aware and ask him to put a one month rx in so with her current rx and the new month rx she will be ok till she sees him again.

## 2021-02-05 ENCOUNTER — Other Ambulatory Visit: Payer: Self-pay

## 2021-02-05 ENCOUNTER — Other Ambulatory Visit (HOSPITAL_COMMUNITY): Payer: Self-pay | Admitting: Physician Assistant

## 2021-02-05 DIAGNOSIS — F99 Mental disorder, not otherwise specified: Secondary | ICD-10-CM

## 2021-02-05 DIAGNOSIS — F5105 Insomnia due to other mental disorder: Secondary | ICD-10-CM

## 2021-02-05 DIAGNOSIS — F29 Unspecified psychosis not due to a substance or known physiological condition: Secondary | ICD-10-CM

## 2021-02-05 MED ORDER — LYBALVI 5-10 MG PO TABS
5.0000 mg | ORAL_TABLET | Freq: Every day | ORAL | 1 refills | Status: DC
Start: 2021-02-05 — End: 2021-03-03
  Filled 2021-02-05: qty 30, 30d supply, fill #0

## 2021-02-05 MED ORDER — TRAZODONE HCL 50 MG PO TABS
50.0000 mg | ORAL_TABLET | Freq: Every day | ORAL | 1 refills | Status: DC
  Filled 2021-02-05: qty 30, 30d supply, fill #0

## 2021-02-05 NOTE — Telephone Encounter (Signed)
Message acknowledged and reviewed.

## 2021-02-05 NOTE — Telephone Encounter (Signed)
Provider was contacted by Glory Buff. Olevia Bowens, RN regarding medication refill. Provider to e-prescribed patient's medication to pharmacy of choice.

## 2021-02-05 NOTE — Progress Notes (Signed)
Provider was contacted by Glory Buff. Olevia Bowens, RN regarding medication refill. Provider to e-prescribed patient's medication to pharmacy of choice.

## 2021-02-10 ENCOUNTER — Telehealth (HOSPITAL_COMMUNITY): Payer: Self-pay | Admitting: Physician Assistant

## 2021-02-10 ENCOUNTER — Telehealth (HOSPITAL_COMMUNITY): Payer: Self-pay | Admitting: *Deleted

## 2021-02-10 ENCOUNTER — Other Ambulatory Visit: Payer: Self-pay

## 2021-02-10 NOTE — Telephone Encounter (Signed)
Tried to call her back to tell her Stephanie Golden at [facility] she has rx there. Her mail box was full and couldn't leave a message.

## 2021-02-10 NOTE — Telephone Encounter (Signed)
Per pt phone call, Olanzapine needs refilled. Patient states [facility] says they do not have it, although it appears in the chart the Olanzapine was written 02/05/21.

## 2021-02-11 ENCOUNTER — Other Ambulatory Visit: Payer: Self-pay

## 2021-03-03 ENCOUNTER — Other Ambulatory Visit: Payer: Self-pay

## 2021-03-03 ENCOUNTER — Encounter (HOSPITAL_COMMUNITY): Payer: Self-pay | Admitting: Physician Assistant

## 2021-03-03 ENCOUNTER — Ambulatory Visit (INDEPENDENT_AMBULATORY_CARE_PROVIDER_SITE_OTHER): Payer: No Payment, Other | Admitting: Physician Assistant

## 2021-03-03 DIAGNOSIS — F99 Mental disorder, not otherwise specified: Secondary | ICD-10-CM

## 2021-03-03 DIAGNOSIS — F29 Unspecified psychosis not due to a substance or known physiological condition: Secondary | ICD-10-CM | POA: Diagnosis not present

## 2021-03-03 DIAGNOSIS — F5105 Insomnia due to other mental disorder: Secondary | ICD-10-CM | POA: Diagnosis not present

## 2021-03-03 MED ORDER — TRAZODONE HCL 50 MG PO TABS
50.0000 mg | ORAL_TABLET | Freq: Every day | ORAL | 2 refills | Status: DC
Start: 2021-03-03 — End: 2022-06-16
  Filled 2021-03-03: qty 30, 30d supply, fill #0

## 2021-03-03 MED ORDER — LYBALVI 5-10 MG PO TABS
5.0000 mg | ORAL_TABLET | Freq: Every day | ORAL | 2 refills | Status: DC
  Filled 2021-03-03: qty 30, 30d supply, fill #0
  Filled 2021-04-21: qty 30, 30d supply, fill #1

## 2021-03-03 NOTE — Progress Notes (Signed)
Stephanie Golden  MRN:  [id number]  Chief Complaint:  Chief Complaint  Patient presents with   Medication Management    IN PERSON, F/U MM   HPI:   Stephanie Golden is a 36-year-old female with a past psychiatric history significant for psychotic disorder with hallucinations and insomnia who presents to [facility] for follow-up and medication management.  Patient is currently being managed on the following medications:  Lybalvi (olanzapine-samidorphan) 5 mg / 10 mg at bedtime Trazodone 50 mg at bedtime  Patient reports no issues or concerns regarding her current medication regimen.  She endorses decent mood and denies experiencing mood swings.  Patient further denies depressive symptoms.  She endorses some anxiety and rates her anxiety a 5 out of 10.  Patient continues to endorse school as a stressor but she states that her family life has been getting better. PHQ-9 screen was performed with the patient scoring a 6.  A GAD-7 screen was also performed with the patient scoring a 6.  Patient is alert and oriented x4, calm, cooperative, and fully engaged in conversation during the encounter.  Patient endorses feeling sleepy.  Patient denies suicidal or homicidal ideations.  She further denies auditory or visual hallucinations and does not appear to be responding to internal/external stimuli.  Patient endorses fair sleep and receives on average 6 hours of sleep each night.  Patient endorses good appetite and eats on average 2 meals per day.  Patient endorses alcohol consumption occasionally.  Patient endorses tobacco use and smokes on average 2 Black and Milds per day.  Patient denies illicit drug use.  Visit Diagnosis:    ICD-10-CM   1. Psychotic disorder with hallucinations (HCC)  F29 OLANZapine-Samidorphan (LYBALVI) 5-10 MG TABS    2. Insomnia due to other mental disorder  F51.05 traZODone (DESYREL) 50 MG  tablet   F99       Past Psychiatric History:  Psychotic disorder with hallucinations Insomnia  Past Medical History:  Past Medical History:  Diagnosis Date   H/O candidiasis 02/22/11   yeast   H/O rubella 02/22/11   yeast   H/O varicella    No pertinent past medical history     Past Surgical History:  Procedure Laterality Date   CESAREAN SECTION  08/07/2011   Procedure: CESAREAN SECTION;  Surgeon: Stephanie Dawley, MD;  Location: [facility];  Service: Gynecology;  Laterality: N/A;  Primary cesarean section with delivery of baby girl at 72. Apgars 9/9.   stitches to r eye      Family Psychiatric History:  Uncle (maternal) - patient states that her uncle has schizophrenia possibly  Family History:  Family History  Problem Relation Age of Onset   Drug abuse Father    Diabetes Maternal Uncle    Hypertension Maternal Uncle    Cancer Paternal Aunt    Cancer Maternal Grandmother    Heart disease Paternal Grandmother    Hypertension Paternal Grandmother    Diabetes Paternal Grandmother    Hypertension Maternal Grandfather    Anesthesia problems Neg Hx    Hypotension Neg Hx    Malignant hyperthermia Neg Hx    Pseudochol deficiency Neg Hx     Social History:  Social History   Socioeconomic History   Marital status: Single    Spouse name: Not on file   Number of children: Not on file   Years of education: Not on file   Highest education  level: Not on file  Occupational History   Not on file  Tobacco Use   Smoking status: Former    Packs/day: 0.25    Types: Cigarettes   Smokeless tobacco: Never  Substance and Sexual Activity   Alcohol use: No   Drug use: No   Sexual activity: Yes    Birth control/protection: None  Other Topics Concern   Not on file  Social History Narrative   Not on file   Social Determinants of Health   Financial Resource Strain: Not on file  Food Insecurity: Not on file  Transportation Needs: Not on file  Physical Activity: Not on file   Stress: Not on file  Social Connections: Not on file    Allergies: No Known Allergies  Metabolic Disorder Labs: No results found for: HGBA1C, MPG No results found for: PROLACTIN No results found for: CHOL, TRIG, HDL, CHOLHDL, VLDL, LDLCALC No results found for: TSH  Therapeutic Level Labs: No results found for: LITHIUM No results found for: VALPROATE No components found for:  CBMZ  Current Medications: Current Outpatient Medications  Medication Sig Dispense Refill   OLANZapine (ZYPREXA) 5 MG tablet Take 1 tablet (5 mg total) by mouth at bedtime. 30 tablet 1   OLANZapine-Samidorphan (LYBALVI) 5-10 MG TABS Take 5 mg by mouth daily. 28 tablet 0   OLANZapine-Samidorphan (LYBALVI) 5-10 MG TABS Take 5 mg by mouth at bedtime. 30 tablet 2   traZODone (DESYREL) 50 MG tablet Take 1 tablet (50 mg total) by mouth at bedtime. 30 tablet 2   No current facility-administered medications for this visit.     Musculoskeletal: Strength & Muscle Tone: within normal limits Gait & Station: normal Patient leans: N/A  Psychiatric Specialty Exam: Review of Systems  Psychiatric/Behavioral:  Positive for sleep disturbance. Negative for decreased concentration, dysphoric mood, hallucinations, self-injury and suicidal ideas. The patient is nervous/anxious. The patient is not hyperactive.    Blood pressure 113/79, pulse 70, height 5\' 3"  (1.6 m), weight 185 lb (83.9 kg).Body mass index is 32.77 kg/m.  General Appearance: Casual  Eye Contact:  Good  Speech:  Clear and Coherent and Normal Rate  Volume:  Normal  Mood:  Anxious and Euthymic  Affect:  Appropriate and Congruent  Thought Process:  Coherent and Descriptions of Associations: Intact  Orientation:  Full (Time, Place, and Person)  Thought Content: WDL   Suicidal Thoughts:  No  Homicidal Thoughts:  No  Memory:  Immediate;   Good Recent;   Good Remote;   Fair  Judgement:  Good  Insight:  Good  Psychomotor Activity:  Normal   Concentration:  Concentration: Good and Attention Span: Good  Recall:  Good  Fund of Knowledge: Good  Language: Good  Akathisia:  No  Handed:  Right  AIMS (if indicated): not done  Assets:  Communication Skills Desire for Improvement Housing Social Support Vocational/Educational  ADL's:  Intact  Cognition: WNL  Sleep:  Fair   Screenings: GAD-7    Personnel officer Visit from 03/03/2021 in [place] from 12/30/2020 in [facility] Office Visit from 10/28/2020 in [facility] Office Visit from 09/16/2020 in [facility]  Total GAD-7 Score 6 10 10 14       PHQ2-9    [place] Office Visit from 03/03/2021 in [place] from 12/30/2020 in [facility] Office Visit from 10/28/2020 in [facility]  Center Office Visit from 09/16/2020 in [facility] ED from 09/13/2020 in [facility]  PHQ-2 Total Score 2 2 2 4 2   PHQ-9 Total Score 6 10 11 16 8       [place] Office Visit from 03/03/2021 in [place] from 12/30/2020 in [facility] Office Visit from 10/28/2020 in [place] No Risk No Risk Low Risk        Assessment and Plan:   Julianah L. Wilbanks is a 29-year-old female with a past psychiatric history significant for psychotic disorder with hallucinations and insomnia who presents to [facility] for follow-up and medication management.  Patient reports no issues or concerns regarding her current medication regimen and appears to be stable.  Patient is requesting refills on all of her medications following the conclusion of the encounter.   Patient's medications to be e-prescribed to pharmacy of choice.  Collaboration of Care: Collaboration of Care: Medication Management AEB provider managing patient's psychiatric medication and Psychiatrist AEB patient being seen by a mental health provider  Patient/Guardian was advised Release of Information must be obtained prior to any record release in order to collaborate their care with an outside provider. Patient/Guardian was advised if they have not already done so to contact the registration department to sign all necessary forms in order for [place] to release information regarding their care.   Consent: Patient/Guardian gives verbal consent for treatment and assignment of benefits for services provided during this visit. Patient/Guardian expressed understanding and agreed to proceed.    1. Psychotic disorder with hallucinations (HCC)  - OLANZapine-Samidorphan (LYBALVI) 5-10 MG TABS; Take 5 mg by mouth at bedtime.  Dispense: 30 tablet; Refill: 2  2. Insomnia due to other mental disorder  - traZODone (DESYREL) 50 MG tablet; Take 1 tablet (50 mg total) by mouth at bedtime.  Dispense: 30 tablet; Refill: 2  Patient to follow up in 2 months Provider spent a total of 11 minutes with the patient/reviewing patient's chart  Malachy Mood, PA 03/03/2021, 5:50 PM

## 2021-03-04 ENCOUNTER — Other Ambulatory Visit: Payer: Self-pay

## 2021-03-08 ENCOUNTER — Other Ambulatory Visit: Payer: Self-pay

## 2021-04-14 ENCOUNTER — Other Ambulatory Visit (HOSPITAL_COMMUNITY): Payer: Self-pay | Admitting: *Deleted

## 2021-04-14 ENCOUNTER — Other Ambulatory Visit: Payer: Self-pay

## 2021-04-14 ENCOUNTER — Telehealth (HOSPITAL_COMMUNITY): Payer: Self-pay | Admitting: Physician Assistant

## 2021-04-14 MED ORDER — LYBALVI 5-10 MG PO TABS: 5.0000 mg | ORAL_TABLET | Freq: Every day | ORAL | 0 refills | Status: DC

## 2021-04-14 NOTE — Progress Notes (Signed)
Patient Given 1 Sample box  7 Tablets ?LYBALVI ?Dose: 5-10 mg Route: Oral Frequency: Daily  ?Dispense Quantity: 7 tablet Refills: 0   ?     ?Sig: Take 5-10 mg by mouth daily.  ? ?

## 2021-04-14 NOTE — Telephone Encounter (Signed)
Patient presented requesting samples for a few days until her patient assistance application is processed at [facility]. OLANZAPINE 5 MG one tablet at bedtime. Marlane Hatcher PA, spoke with patient and followed up with immediate assistance.

## 2021-04-15 ENCOUNTER — Other Ambulatory Visit: Payer: Self-pay

## 2021-04-15 ENCOUNTER — Telehealth (HOSPITAL_COMMUNITY): Payer: Self-pay | Admitting: *Deleted

## 2021-04-15 NOTE — Telephone Encounter (Signed)
Fax for provider to complete the patient assistance form sent over by [facility]. In addition to use sending back the signed form they are waiting for patient to provide proof of income. Will have Eddie PA sign today and fax it back over.  ?

## 2021-04-15 NOTE — Telephone Encounter (Signed)
[name] from [facility] sent over the following re patient[9:13 AM] Stephanie Golden ? ? ? ? ?Hey, I have Stephanie Golden here mrn: [id number] dob: 1985-05-31.  She last picked up Lybalvi from [place] on 03/08/2021 and acknowledged that we did give her the patient assistance application on that date with instructions to return it before her next refill was due.  Well, she is out and needs it now and we are not filling it again bc it is $1800.  I faxed Minerva Fester ? ? ?We provided her with 7 days of Lybalvi samples yesterday at her request not having the information she had not attended to her patient assistance application. She will have at least a week without her medicine as [name] is saying it will take approx two weeks to get her own in thru patient assistance. This patient has been given samples in the past of other meds and at this time she is out because of her poor planning we will not provide additional samples at this time.  ?

## 2021-04-16 ENCOUNTER — Other Ambulatory Visit: Payer: Self-pay

## 2021-04-20 ENCOUNTER — Other Ambulatory Visit: Payer: Self-pay

## 2021-04-21 ENCOUNTER — Other Ambulatory Visit: Payer: Self-pay

## 2021-04-22 NOTE — Telephone Encounter (Signed)
Message acknowledged and reviewed.  Patient's follow-up appointment is scheduled for 04/30/2021.

## 2021-04-30 ENCOUNTER — Ambulatory Visit (INDEPENDENT_AMBULATORY_CARE_PROVIDER_SITE_OTHER): Payer: No Payment, Other | Admitting: Physician Assistant

## 2021-04-30 ENCOUNTER — Encounter (HOSPITAL_COMMUNITY): Payer: Self-pay | Admitting: Physician Assistant

## 2021-04-30 ENCOUNTER — Other Ambulatory Visit: Payer: Self-pay

## 2021-04-30 DIAGNOSIS — F99 Mental disorder, not otherwise specified: Secondary | ICD-10-CM

## 2021-04-30 DIAGNOSIS — F5105 Insomnia due to other mental disorder: Secondary | ICD-10-CM

## 2021-04-30 DIAGNOSIS — F29 Unspecified psychosis not due to a substance or known physiological condition: Secondary | ICD-10-CM | POA: Diagnosis not present

## 2021-04-30 MED ORDER — LYBALVI 5-10 MG PO TABS
5.0000 mg | ORAL_TABLET | Freq: Every day | ORAL | 2 refills | Status: DC
  Filled 2021-04-30 – 2021-06-04 (×3): qty 30, 30d supply, fill #0
  Filled 2021-07-02: qty 30, 30d supply, fill #1

## 2021-04-30 NOTE — Progress Notes (Addendum)
BH MD/PA/NP OP Progress Note ? ?04/30/2021 11:15 AM ?[name]  ?MRN:  [id number] ? ?Chief Complaint:  ?Chief Complaint  ?Patient presents with  ? Medication Management  ? ?HPI:  ? ?Stephanie Golden is a 36 year old female with a past psychiatric history significant for insomnia and psychotic disorder with hallucinations who presents to [facility] for follow-up and medication management.  Patient is currently being managed on the following medication: Olanzapine-Samidorphan Garnet Sierras) '5mg'$ /10 mg at bedtime. ? ?Patient continues to report no issues or concerns regarding her use of Lybalvi.  She does report that she experiences some drowsiness when not taking the medication.  As of late, patient reports that her mood has been up and down due to not being able to find work.  Patient states that she is trying to find second or third shift work, preferably.  Patient reports that she also had to leave grad school due to difficulties she had with one class.  Lastly, patient endorses some stressors related to maintaining the finances of her current apartment. ? ?Patient denies experiencing depressive symptoms but states that there are some days where she experiences sleep disturbances, lack of motivation, and decreased energy.  Patient denies anxiety and denies any other stressors besides the ones mentioned today.  A PHQ-9 screen was performed with the patient scoring an 8.  A GAD-7 screen was also performed with the patient scoring a 6. ? ?Patient is alert and oriented x4, pleasant, cooperative, and fully engaged in conversation during the encounter.  Patient endorses neutral mood but states that she is not miserable.  Patient denies suicidal or homicidal ideations.  She further denies auditory or visual hallucinations and does not appear to be responding to internal/external stimuli.  Patient endorses fair sleep and receives on average 6 hours of sleep each night.  Patient  endorses decreased appetite and states that she has to force herself to eat at times.  Patient states that she managed to have at least 1 meal per day.  Patient endorses alcohol consumption sparingly.  Patient endorses tobacco use and smokes roughly 2 Black and Milds a day.  Patient denies illicit drug use. ? ?Visit Diagnosis:  ?  ICD-10-CM   ?1. Insomnia due to other mental disorder  F51.05   ? F99   ?  ?2. Psychotic disorder with hallucinations (HCC)  F29 OLANZapine-Samidorphan (LYBALVI) 5-10 MG TABS  ?  ? ? ?Past Psychiatric History:  ?Psychotic disorder with hallucinations ?Insomnia ? ?Past Medical History:  ?Past Medical History:  ?Diagnosis Date  ? H/O candidiasis 02/22/11  ? yeast  ? H/O rubella 02/22/11  ? yeast  ? H/O varicella   ? No pertinent past medical history   ?  ?Past Surgical History:  ?Procedure Laterality Date  ? CESAREAN SECTION  08/07/2011  ? Procedure: CESAREAN SECTION;  Surgeon: Ena Dawley, MD;  Location: [facility];  Service: Gynecology;  Laterality: N/A;  Primary cesarean section with delivery of baby girl at 3. Apgars 9/9.  ? stitches to r eye    ? ? ?Family Psychiatric History:  ?Uncle (maternal) - patient states that her uncle has schizophrenia possibly ? ?Family History:  ?Family History  ?Problem Relation Age of Onset  ? Drug abuse Father   ? Diabetes Maternal Uncle   ? Hypertension Maternal Uncle   ? Cancer Paternal Aunt   ? Cancer Maternal Grandmother   ? Heart disease Paternal Grandmother   ? Hypertension Paternal Grandmother   ? Diabetes  Paternal Grandmother   ? Hypertension Maternal Grandfather   ? Anesthesia problems Neg Hx   ? Hypotension Neg Hx   ? Malignant hyperthermia Neg Hx   ? Pseudochol deficiency Neg Hx   ? ? ?Social History:  ?Social History  ? ?Socioeconomic History  ? Marital status: Single  ?  Spouse name: Not on file  ? Number of children: Not on file  ? Years of education: Not on file  ? Highest education level: Not on file  ?Occupational History  ? Not on file   ?Tobacco Use  ? Smoking status: Former  ?  Packs/day: 0.25  ?  Types: Cigarettes  ? Smokeless tobacco: Never  ?Substance and Sexual Activity  ? Alcohol use: No  ? Drug use: No  ? Sexual activity: Yes  ?  Birth control/protection: None  ?Other Topics Concern  ? Not on file  ?Social History Narrative  ? Not on file  ? ?Social Determinants of Health  ? ?Financial Resource Strain: Not on file  ?Food Insecurity: Not on file  ?Transportation Needs: Not on file  ?Physical Activity: Not on file  ?Stress: Not on file  ?Social Connections: Not on file  ? ? ?Allergies: No Known Allergies ? ?Metabolic Disorder Labs: ?No results found for: HGBA1C, MPG ?No results found for: PROLACTIN ?No results found for: CHOL, TRIG, HDL, CHOLHDL, VLDL, LDLCALC ?No results found for: TSH ? ?Therapeutic Level Labs: ?No results found for: LITHIUM ?No results found for: VALPROATE ?No components found for:  CBMZ ? ?Current Medications: ?Current Outpatient Medications  ?Medication Sig Dispense Refill  ? OLANZapine (ZYPREXA) 5 MG tablet Take 1 tablet (5 mg total) by mouth at bedtime. 30 tablet 1  ? OLANZapine-Samidorphan (LYBALVI) 5-10 MG TABS Take 5 mg by mouth daily. 28 tablet 0  ? OLANZapine-Samidorphan (LYBALVI) 5-10 MG TABS Take 5-10 mg by mouth daily. 7 tablet 0  ? OLANZapine-Samidorphan (LYBALVI) 5-10 MG TABS Take 5 mg (1 tablet)  by mouth at bedtime. 30 tablet 2  ? traZODone (DESYREL) 50 MG tablet Take 1 tablet (50 mg total) by mouth at bedtime. 30 tablet 2  ? ?No current facility-administered medications for this visit.  ? ? ? ?Musculoskeletal: ?Strength & Muscle Tone: within normal limits ?Gait & Station: normal ?Patient leans: N/A ? ?Psychiatric Specialty Exam: ?Review of Systems  ?Psychiatric/Behavioral:  Positive for sleep disturbance. Negative for decreased concentration, dysphoric mood, hallucinations, self-injury and suicidal ideas. The patient is not nervous/anxious and is not hyperactive.    ?Blood pressure 129/72, pulse 86, height  '5\' 3"'$  (1.6 m), weight 185 lb (83.9 kg), SpO2 100 %.Body mass index is 32.77 kg/m?.  ?General Appearance: Well Groomed  ?Eye Contact:  Good  ?Speech:  Clear and Coherent and Normal Rate  ?Volume:  Normal  ?Mood:  Euthymic  ?Affect:  Appropriate  ?Thought Process:  Coherent and Descriptions of Associations: Intact  ?Orientation:  Full (Time, Place, and Person)  ?Thought Content: WDL   ?Suicidal Thoughts:  No  ?Homicidal Thoughts:  No  ?Memory:  Immediate;   Good ?Recent;   Good ?Remote;   Fair  ?Judgement:  Good  ?Insight:  Good  ?Psychomotor Activity:  Normal  ?Concentration:  Concentration: Good and Attention Span: Good  ?Recall:  Good  ?Fund of Knowledge: Good  ?Language: Good  ?Akathisia:  No  ?Handed:  Right  ?AIMS (if indicated): not done  ?Assets:  Communication Skills ?Desire for Improvement ?Housing ?Social Support ?Vocational/Educational  ?ADL's:  Intact  ?Cognition: WNL  ?  Sleep:  Fair  ? ?Screenings: ?GAD-7   ? ?[place] Office Visit from 04/30/2021 in [facility] Office Visit from 03/03/2021 in [facility] Clinical Support from 12/30/2020 in [facility] Office Visit from 10/28/2020 in [facility] Office Visit from 09/16/2020 in [facility]  ?Total GAD-7 Score '6 6 10 10 14  '$ ? ?  ? ?PHQ2-9   ? ?[place] Office Visit from 04/30/2021 in [facility] Office Visit from 03/03/2021 in [facility] Clinical Support from 12/30/2020 in [facility] Office Visit from 10/28/2020 in [facility] Office Visit from 09/16/2020 in [facility]  ?PHQ-2 Total Score '2 2 2 2 4  '$ ?PHQ-9 Total Score '8 6 10 11 16  '$ ? ?  ? ?[place] Office Visit from 04/30/2021 in [facility] Office Visit from 03/03/2021 in [facility] Clinical Support from 12/30/2020 in [facility]  ?C-SSRS RISK CATEGORY No Risk No Risk No Risk  ? ?  ? ? ? ?Assessment and Plan:  ? ?Walsie L[employer]

## 2021-05-02 ENCOUNTER — Encounter (HOSPITAL_COMMUNITY): Payer: Self-pay | Admitting: Physician Assistant

## 2021-05-19 ENCOUNTER — Other Ambulatory Visit: Payer: Self-pay

## 2021-05-20 ENCOUNTER — Other Ambulatory Visit: Payer: Self-pay

## 2021-05-27 ENCOUNTER — Other Ambulatory Visit: Payer: Self-pay

## 2021-06-04 ENCOUNTER — Other Ambulatory Visit: Payer: Self-pay

## 2021-06-17 ENCOUNTER — Other Ambulatory Visit: Payer: Self-pay

## 2021-07-02 ENCOUNTER — Other Ambulatory Visit: Payer: Self-pay

## 2021-07-02 ENCOUNTER — Encounter (HOSPITAL_COMMUNITY): Payer: Self-pay | Admitting: Physician Assistant

## 2021-07-02 ENCOUNTER — Telehealth: Payer: Self-pay

## 2021-07-02 ENCOUNTER — Ambulatory Visit (INDEPENDENT_AMBULATORY_CARE_PROVIDER_SITE_OTHER): Payer: No Payment, Other | Admitting: Physician Assistant

## 2021-07-02 VITALS — BP 130/78 | HR 64 | Ht 63.0 in | Wt 185.0 lb

## 2021-07-02 DIAGNOSIS — F29 Unspecified psychosis not due to a substance or known physiological condition: Secondary | ICD-10-CM | POA: Diagnosis not present

## 2021-07-02 DIAGNOSIS — F99 Mental disorder, not otherwise specified: Secondary | ICD-10-CM

## 2021-07-02 DIAGNOSIS — F5105 Insomnia due to other mental disorder: Secondary | ICD-10-CM

## 2021-07-02 NOTE — Telephone Encounter (Signed)
Patient is enrolled in the patient assistance program for Lybalvi-if you refill it can you please send the script to [facility] at [address] [place], [place] [address] as this is the [facility] pt assistance dispensing pharmacy, thanks!

## 2021-07-05 ENCOUNTER — Encounter (HOSPITAL_COMMUNITY): Payer: Self-pay | Admitting: Physician Assistant

## 2021-07-05 NOTE — Progress Notes (Addendum)
[place] MD/PA/NP OP Progress Note  07/02/2021 11:30 AM Stephanie Golden  MRN:  [id number]  Chief Complaint:  Chief Complaint  Patient presents with   Medication Management    IN PERSON, F/U MM   HPI:   Stephanie Golden is a 36 year old female with a past psychiatric history significant for insomnia and psychotic disorder with hallucinations who presents to [facility] for follow-up and medication management.  Patient is currently being managed on the following medication: Olanzapine-Samidorphan Stephanie Golden) '5mg'$ /10 mg at bedtime.  Patient reports that the use of her medication has been going well.  Patient denies experiencing any adverse side effects.  She further denies experiencing depression and states that her anxiety has not been much of an issue.  Patient does endorse some irritation and when she is not irritated, she is often bored.  Patient is currently working at a [employer] in [place] and denies any issues with her job.  Patient's only stressor involves paying for bills.  A PHQ-9 screen was performed with the patient scoring a 4.  A GAD-7 screen was also performed with the patient scoring a 5.  Patient is alert and oriented x4, pleasant, cooperative, and fully engaged in conversation during the encounter.  Patient endorses stable mood.  Patient denies suicidal or homicidal ideations.  She further denies auditory or visual hallucinations and does not appear to be responding to internal/external stimuli.  Patient endorses fair sleep and receives on average 5 hours of sleep each night.  Patient endorses good appetite and eats on average 2-3 meals per day.  Patient denies alcohol consumption and illicit drug use.  Patient endorses tobacco use and smokes on average two Black and Milds a day.  Visit Diagnosis:  No diagnosis found.   Past Psychiatric History:  Psychotic disorder with hallucinations Insomnia  Past Medical History:  Past  Medical History:  Diagnosis Date   H/O candidiasis 02/22/11   yeast   H/O rubella 02/22/11   yeast   H/O varicella    No pertinent past medical history     Past Surgical History:  Procedure Laterality Date   CESAREAN SECTION  08/07/2011   Procedure: CESAREAN SECTION;  Surgeon: Ena Dawley, MD;  Location: [facility];  Service: Gynecology;  Laterality: N/A;  Primary cesarean section with delivery of baby girl at 105. Apgars 9/9.   stitches to r eye      Family Psychiatric History:  Uncle (maternal) - patient states that her uncle has schizophrenia possibly  Family History:  Family History  Problem Relation Age of Onset   Drug abuse Father    Diabetes Maternal Uncle    Hypertension Maternal Uncle    Cancer Paternal Aunt    Cancer Maternal Grandmother    Heart disease Paternal Grandmother    Hypertension Paternal Grandmother    Diabetes Paternal Grandmother    Hypertension Maternal Grandfather    Anesthesia problems Neg Hx    Hypotension Neg Hx    Malignant hyperthermia Neg Hx    Pseudochol deficiency Neg Hx     Social History:  Social History   Socioeconomic History   Marital status: Single    Spouse name: Not on file   Number of children: Not on file   Years of education: Not on file   Highest education level: Not on file  Occupational History   Not on file  Tobacco Use   Smoking status: Former    Packs/day: 0.25    Types: Cigarettes  Smokeless tobacco: Never  Substance and Sexual Activity   Alcohol use: No   Drug use: No   Sexual activity: Yes    Birth control/protection: None  Other Topics Concern   Not on file  Social History Narrative   Not on file   Social Determinants of Health   Financial Resource Strain: Not on file  Food Insecurity: Not on file  Transportation Needs: Not on file  Physical Activity: Not on file  Stress: Not on file  Social Connections: Not on file    Allergies: No Known Allergies  Metabolic Disorder Labs: No results  found for: "HGBA1C", "MPG" No results found for: "PROLACTIN" No results found for: "CHOL", "TRIG", "HDL", "CHOLHDL", "VLDL", "LDLCALC" No results found for: "TSH"  Therapeutic Level Labs: No results found for: "LITHIUM" No results found for: "VALPROATE" No results found for: "CBMZ"  Current Medications: Current Outpatient Medications  Medication Sig Dispense Refill   OLANZapine (ZYPREXA) 5 MG tablet Take 1 tablet (5 mg total) by mouth at bedtime. 30 tablet 1   OLANZapine-Samidorphan (LYBALVI) 5-10 MG TABS Take 5 mg by mouth daily. 28 tablet 0   OLANZapine-Samidorphan (LYBALVI) 5-10 MG TABS Take 5-10 mg by mouth daily. 7 tablet 0   OLANZapine-Samidorphan (LYBALVI) 5-10 MG TABS Take 1 tablet by mouth once nightly at bedtime. 30 tablet 2   traZODone (DESYREL) 50 MG tablet Take 1 tablet (50 mg total) by mouth at bedtime. 30 tablet 2   No current facility-administered medications for this visit.     Musculoskeletal: Strength & Muscle Tone: within normal limits Gait & Station: normal Patient leans: N/A  Psychiatric Specialty Exam: Review of Systems  Psychiatric/Behavioral:  Positive for sleep disturbance. Negative for decreased concentration, dysphoric mood, hallucinations, self-injury and suicidal ideas. The patient is not nervous/anxious and is not hyperactive.     Blood pressure 130/78, pulse 64, height '5\' 3"'$  (1.6 m), weight 185 lb (83.9 kg).Body mass index is 32.77 kg/m.  General Appearance: Well Groomed  Eye Contact:  Good  Speech:  Clear and Coherent and Normal Rate  Volume:  Normal  Mood:  Euthymic  Affect:  Appropriate  Thought Process:  Coherent and Descriptions of Associations: Intact  Orientation:  Full (Time, Place, and Person)  Thought Content: WDL   Suicidal Thoughts:  No  Homicidal Thoughts:  No  Memory:  Immediate;   Good Recent;   Good Remote;   Fair  Judgement:  Good  Insight:  Good  Psychomotor Activity:  Normal  Concentration:  Concentration: Good and  Attention Span: Good  Recall:  Good  Fund of Knowledge: Good  Language: Good  Akathisia:  No  Handed:  Right  AIMS (if indicated): not done  Assets:  Communication Skills Desire for Improvement Housing Social Support Vocational/Educational  ADL's:  Intact  Cognition: WNL  Sleep:  Fair   Screenings: GAD-7    Personnel officer Visit from 07/02/2021 in [facility] Office Visit from 04/30/2021 in [facility] Office Visit from 03/03/2021 in [place] from 12/30/2020 in [facility] Office Visit from 10/28/2020 in [facility]  Total GAD-7 Score '5 6 6 10 10      '$ PHQ2-9    [place] Office Visit from 07/02/2021 in [facility] Office Visit from 04/30/2021 in [facility] Office Visit from 03/03/2021 in [place] from 12/30/2020 in  [facility] Office Visit from 10/28/2020 in [place]  PHQ-2 Total Score '2 2 2 2 2  '$ PHQ-9 Total Score '4 8 6 10 11      '$ [place] Office Visit from 07/02/2021 in [facility] Office Visit from 04/30/2021 in [facility] Office Visit from 03/03/2021 in [place] No Risk No Risk No Risk        Assessment and Plan:   Hema L. Binning is a 36 year old female with a past psychiatric history significant for insomnia and psychotic disorder with hallucinations who presents to [facility] for follow-up and medication management.  Patient reports no issues or concerns regarding her current medication regimen.  Patient denies depression or anxiety and appears to be stable on her current medication.   Patient to continue taking medication as prescribed.  Patient's medication to be prescribed to pharmacy of choice.  Collaboration of Care: Collaboration of Care: Medication Management AEB provider managing patient's psychiatric medication and Psychiatrist AEB patient being seen by a mental health provider  Patient/Guardian was advised Release of Information must be obtained prior to any record release in order to collaborate their care with an outside provider. Patient/Guardian was advised if they have not already done so to contact the registration department to sign all necessary forms in order for [place] to release information regarding their care.   Consent: Patient/Guardian gives verbal consent for treatment and assignment of benefits for services provided during this visit. Patient/Guardian expressed understanding and agreed to proceed.   1. Insomnia due to other mental disorder   2. Psychotic disorder with hallucinations (HCC)  - OLANZapine-Samidorphan (LYBALVI) 5-10 MG TABS; Take 1 tablet by mouth once nightly at bedtime.  Dispense: 30 tablet; Refill: 2  Patient to follow up in 2 months Provider spent a total of 20 minutes with the patient/reviewing patient's chart  Malachy Mood, PA 07/02/2021, 11:15 AM

## 2021-07-07 ENCOUNTER — Other Ambulatory Visit: Payer: Self-pay

## 2021-07-07 MED ORDER — LYBALVI 5-10 MG PO TABS: 5.0000 mg | ORAL_TABLET | Freq: Every day | ORAL | 2 refills | Status: DC

## 2021-07-07 NOTE — Telephone Encounter (Signed)
Message acknowledged and reviewed.

## 2021-07-08 ENCOUNTER — Other Ambulatory Visit: Payer: Self-pay

## 2021-07-09 ENCOUNTER — Other Ambulatory Visit: Payer: Self-pay

## 2021-07-15 ENCOUNTER — Other Ambulatory Visit: Payer: Self-pay

## 2021-07-30 ENCOUNTER — Other Ambulatory Visit: Payer: Self-pay

## 2021-09-03 ENCOUNTER — Other Ambulatory Visit: Payer: Self-pay

## 2021-09-03 ENCOUNTER — Ambulatory Visit (INDEPENDENT_AMBULATORY_CARE_PROVIDER_SITE_OTHER): Payer: No Payment, Other | Admitting: Physician Assistant

## 2021-09-03 ENCOUNTER — Encounter (HOSPITAL_COMMUNITY): Payer: Self-pay | Admitting: Physician Assistant

## 2021-09-03 VITALS — BP 119/89 | HR 90 | Ht 63.0 in | Wt 182.0 lb

## 2021-09-03 DIAGNOSIS — F29 Unspecified psychosis not due to a substance or known physiological condition: Secondary | ICD-10-CM | POA: Diagnosis not present

## 2021-09-03 NOTE — Progress Notes (Unsigned)
[place] MD/PA/NP OP Progress Note  09/03/2021 11:30 AM Stephanie Golden  MRN:  [id number]  Chief Complaint:  Chief Complaint  Patient presents with   Medication Management   HPI:   Stephanie Golden is a 36 year old female with a past psychiatric history significant for insomnia and psychotic disorder with hallucinations who presents to [facility] for follow-up and medication management.  Patient is currently being managed on the following medication: Olanzapine-Samidorphan (Lybalvi) '5mg'$ /10 mg at bedtime.  Patient presents to the encounter stating that she has not been on her medications since June.  She reports not taking her medications due to the drowsiness she experiences while taking the medication.  Patient states that taking the medication has been challenging since she works third shift and is only able to take her medication the end of her shift.  She adds that she does not receive enough sleep between the time she gets off from work and the time she needs to pick up her daughter to be able to take her medication.  Since being off the medication, patient endorses irritability that occurs at random as well as during faint voices.  When asked about the [facility] voices, patient states that she has been experiencing a numbness and tingling sensation in her brain and behind her ear.  She further describes sensation as screeching in her ears and thinks that it is the voices trying to get through.  Prior to being off the medication, patient states that she benefited from the use of the medication.  It appears patient's concern is that the medication makes her drowsy even when she is able to receive some sleep and she is fearful that she may doze off while driving after waking up.  Patient endorses some depression attributed to pills.  She also reports experiencing anxiety on occasion.  A PHQ-9 screen was performed with the patient scoring a 13.  A GAD-7 screen was also  performed with the patient scoring a 9.  Patient is alert and oriented x4, pleasant, cooperative, and fully engaged in conversation during the encounter.  Patient states that she feels well but is currently tired.  Patient denies suicidal or homicidal ideations.  She further denies auditory or visual hallucinations and does not appear to be responding to internal/external stimuli.  Patient reports that she is only able to receive between 3 and 4 hours of sleep during the weekday after working.  On the weekends, patient reports that she is able to receive plenty of sleep.  Patient endorses good appetite and eats on average 2 meals per day.  Patient endorses alcohol consumption sparingly.  Patient endorses tobacco use in the form of Black and Milds.  Patient denies illicit drug use.  Visit Diagnosis:    ICD-10-CM   1. 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 Apgars 9/9.   stitches to r eye  Family Psychiatric History:  Uncle (maternal) - patient states that her uncle has schizophrenia possibly  Family History:  Family History  Problem Relation Age of Onset   Drug abuse Father    Diabetes Maternal Uncle    Hypertension Maternal Uncle    Cancer Paternal Aunt    Cancer Maternal Grandmother    Heart disease Paternal Grandmother    Hypertension Paternal Grandmother    Diabetes Paternal Grandmother    Hypertension Maternal Grandfather     Anesthesia problems Neg Hx    Hypotension Neg Hx    Malignant hyperthermia Neg Hx    Pseudochol deficiency Neg Hx     Social History:  Social History   Socioeconomic History   Marital status: Single    Spouse name: Not on file   Number of children: Not on file   Years of education: Not on file   Highest education level: Not on file  Occupational History   Not on file  Tobacco Use   Smoking status: Former    Packs/day: 0.25    Types: Cigarettes   Smokeless tobacco: Never  Substance and Sexual Activity   Alcohol use: No   Drug use: No   Sexual activity: Yes    Birth control/protection: None  Other Topics Concern   Not on file  Social History Narrative   Not on file   Social Determinants of Health   Financial Resource Strain: Not on file  Food Insecurity: Not on file  Transportation Needs: Not on file  Physical Activity: Not on file  Stress: Not on file  Social Connections: Not on file    Allergies: No Known Allergies  Metabolic Disorder Labs: No results found for: "HGBA1C", "MPG" No results found for: "PROLACTIN" No results found for: "CHOL", "TRIG", "HDL", "CHOLHDL", "VLDL", "LDLCALC" No results found for: "TSH"  Therapeutic Level Labs: No results found for: "LITHIUM" No results found for: "VALPROATE" No results found for: "CBMZ"  Current Medications: Current Outpatient Medications  Medication Sig Dispense Refill   OLANZapine (ZYPREXA) 5 MG tablet Take 1 tablet (5 mg total) by mouth at bedtime. 30 tablet 1   OLANZapine-Samidorphan (LYBALVI) 5-10 MG TABS Take 5 mg by mouth daily. 28 tablet 0   OLANZapine-Samidorphan (LYBALVI) 5-10 MG TABS Take 5-10 mg by mouth daily. 7 tablet 0   OLANZapine-Samidorphan (LYBALVI) 5-10 MG TABS Take 1 tablet by mouth once nightly at bedtime. 30 tablet 2   traZODone (DESYREL) 50 MG tablet Take 1 tablet (50 mg total) by mouth at bedtime. 30 tablet 2   No current facility-administered medications for this visit.      Musculoskeletal: Strength & Muscle Tone: within normal limits Gait & Station: normal Patient leans: N/A  Psychiatric Specialty Exam: Review of Systems  Psychiatric/Behavioral:  Positive for sleep disturbance. Negative for decreased concentration, dysphoric mood, hallucinations, self-injury and suicidal ideas. The patient is nervous/anxious. The patient is not hyperactive.     Blood pressure 119/89, pulse 90, height '5\' 3"'$  (1.6 m), weight 182 lb (82.6 kg), SpO2 100 %.Body mass index is 32.24 kg/m.  General Appearance: Well Groomed  Eye Contact:  Good  Speech:  Clear and Coherent and Normal Rate  Volume:  Normal  Mood:  Anxious and Euthymic  Affect:  Appropriate  Thought Process:  Coherent and Descriptions of Associations: Intact  Orientation:  Full (Time, Place, and Person)  Thought Content: WDL   Suicidal Thoughts:  No  Homicidal Thoughts:  No  Memory:  Immediate;   Good Recent;  Good Remote;   Fair  Judgement:  Good  Insight:  Good  Psychomotor Activity:  Normal  Concentration:  Concentration: Good and Attention Span: Good  Recall:  Good  Fund of Knowledge: Good  Language: Good  Akathisia:  No  Handed:  Right  AIMS (if indicated): not done  Assets:  Communication Skills Desire for Improvement Housing Social Support Vocational/Educational  ADL's:  Intact  Cognition: WNL  Sleep:  Fair   Screenings: GAD-7    Personnel officer Visit from 09/03/2021 in [facility] Office Visit from 07/02/2021 in [facility] Office Visit from 04/30/2021 in [facility] Office Visit from 03/03/2021 in [place] from 12/30/2020 in [facility]  Total GAD-7 Score '9 5 6 6 10      '$ PHQ2-9    [place] Office Visit from 09/03/2021 in [facility] Office Visit from 07/02/2021 in [facility] Office Visit from 04/30/2021 in [facility] Office Visit from 03/03/2021 in [place] from 12/30/2020 in [place]  PHQ-2 Total Score '5 2 2 2 2  '$ PHQ-9 Total Score '13 4 8 6 10      '$ [place] Office Visit from 09/03/2021 in [facility] Office Visit from 07/02/2021 in [facility] Office Visit from 04/30/2021 in [place] No Risk No Risk No Risk        Assessment and Plan:   Stephanie Golden is a 36 year old female with a past psychiatric history significant for insomnia and psychotic disorder with hallucinations who presents to [facility] for follow-up and medication management.  Patient reports that she has not been taking her medication due to the drowsiness she experiences upon waking up from the use of her medication.  Patient has been off her medication since June due to the excessive drowsiness.  Since being off her medications, patient endorses some irritability as well as hearing faint voices.  Provider informed patient the importance of being on a medication to prevent reemergence of her symptoms.  Patient agreed to take her medications and states that she will try taking her medications during the weekend where she is able to receive sleep.   Collaboration of Care: Collaboration of Care: Medication Management AEB provider managing patient's psychiatric medication and Psychiatrist AEB patient being seen by a mental health provider  Patient/Guardian was advised Release of Information must be obtained prior to any record release in order to collaborate their care with an outside provider. Patient/Guardian was advised if they have not already done so to contact the registration department to sign all necessary forms  in order for [place] to release information regarding their care.   Consent: Patient/Guardian gives verbal consent for treatment and assignment of benefits for services provided during this visit. Patient/Guardian expressed understanding and agreed to proceed.   1. Psychotic disorder with hallucinations ([place]) Patient to continue taking Lybalvi 5 mg daily for the management of her psychotic disorder  Patient to follow up in 2 months Provider spent a total of 31 minutes with the patient/reviewing patient's chart  Malachy Mood, PA 09/03/2021, 11:30 AM

## 2021-09-09 ENCOUNTER — Telehealth: Payer: Self-pay

## 2021-09-09 ENCOUNTER — Other Ambulatory Visit: Payer: Self-pay

## 2021-09-09 NOTE — Telephone Encounter (Signed)
Received #30 Lybalvi '5mg'$ /'10mg'$  from [facility] today (patient assistance program, no charge to patient).  Called patient to inform, but no answer-voicemail box full.

## 2021-09-23 ENCOUNTER — Other Ambulatory Visit: Payer: Self-pay

## 2021-09-23 ENCOUNTER — Other Ambulatory Visit (HOSPITAL_COMMUNITY): Payer: Self-pay | Admitting: Physician Assistant

## 2021-09-23 DIAGNOSIS — F29 Unspecified psychosis not due to a substance or known physiological condition: Secondary | ICD-10-CM

## 2021-10-22 ENCOUNTER — Other Ambulatory Visit: Payer: Self-pay

## 2021-11-02 ENCOUNTER — Other Ambulatory Visit: Payer: Self-pay

## 2021-11-05 ENCOUNTER — Encounter (HOSPITAL_COMMUNITY): Payer: No Payment, Other | Admitting: Physician Assistant

## 2021-11-05 ENCOUNTER — Other Ambulatory Visit (HOSPITAL_COMMUNITY): Payer: Self-pay | Admitting: Physician Assistant

## 2021-11-05 ENCOUNTER — Telehealth (HOSPITAL_COMMUNITY): Payer: Self-pay | Admitting: Physician Assistant

## 2021-11-05 ENCOUNTER — Other Ambulatory Visit: Payer: Self-pay

## 2021-11-05 DIAGNOSIS — F29 Unspecified psychosis not due to a substance or known physiological condition: Secondary | ICD-10-CM

## 2021-11-05 MED ORDER — LYBALVI 5-10 MG PO TABS
5.0000 mg | ORAL_TABLET | Freq: Every day | ORAL | 2 refills | Status: DC
  Filled 2021-11-05: qty 30, 30d supply, fill #0

## 2021-11-05 NOTE — Telephone Encounter (Signed)
Provider was contacted by Nicola Girt regarding patient's medication refill request. Patient's medication to be e-prescribed to pharmacy of choice.

## 2021-11-05 NOTE — Progress Notes (Signed)
Provider was contacted by Nicola Girt regarding patient's medication refill request. Patient's medication to be e-prescribed to pharmacy of choice.

## 2021-11-08 NOTE — Telephone Encounter (Signed)
Messaged received.

## 2021-11-10 ENCOUNTER — Other Ambulatory Visit: Payer: Self-pay

## 2021-11-12 ENCOUNTER — Other Ambulatory Visit: Payer: Self-pay

## 2021-11-15 NOTE — Patient Instructions (Signed)
Thank you for attending your appointment today.  -- We did not make any medication changes today. Please continue medications as prescribed and make sure to take every day to maximize effectiveness.   Please do not make any changes to medications without first discussing with your provider. If you are experiencing a psychiatric emergency, please call 911 or present to your nearest emergency department. Additional crisis, medication management, and therapy resources are included below.  [facility]  [address], [place], [place] [address] [phone] WALK-IN URGENT CARE 24/7 FOR ANYONE [address] [place], [place]  [phone] Fax: [phone] [email or web address] *Interpreters available *Accepts all insurance and uninsured for Urgent Care needs *Accepts Medicaid and uninsured for outpatient treatment (below)      ONLY FOR [facility]  Below:    Outpatient New Patient Assessment/Therapy Walk-ins:        Monday -Thursday 8am until slots are full.        Every Friday 1pm-4pm  (first come, first served)                   New Patient Psychiatry/Medication Management        Monday-Friday 8am-11am (first come, first served)               For all walk-ins we ask that you arrive by 7:15am, because patients will be seen in the order of arrival.

## 2021-11-15 NOTE — Progress Notes (Signed)
[place] MD Outpatient Progress Note  11/16/21 7:59 AM [name]  MRN:  [id number]  Assessment:  Stephanie Golden presents for follow-up evaluation in-person. Today, patient reports intermittent adherence to Stephanie Golden but despite this denies any signs/sx of psychosis. She does endorse that on days without taking medication she may notice "warning signs" of impending psychosis such as feeling "hazy" or "jumpy" at which time she will resume medications. Discussed importance of daily adherence to medications; it appears that patient's third shift job and concern for weight gain are primary barriers to consistent use. Discussed taking medication after returning from job prior to going to sleep; also introduced more weight neutral antipsychotic options (specifically Stephanie Golden as patient identifies desire for LAI - however she specifically desires to be on Q53monthLAI which would only be possible with Stephanie Golden and patient expressed hesitancy to have to build up to this dosing interval). She elected to remain on current medications for time being and continue to consider options while she prioritizes behavioral modifications for weight loss. Will obtain updated labs as below.  Plan to RTC in 2 months in person.   Identifying Information: Stephanie BIRTis a 36y.o. female with a history of unspecified psychotic disorder who [facility]   Plan:  # Psychosis, unspecified Past medication trials:  Status of problem: stable Interventions: -- Continue Stephanie Golden 5-10 mg nightly -- Stephanie Golden and Stephanie D ordered today  # Weight gain on antipsychotic Past medication trials: none prior Status of problem: not improving as expected Interventions: -- Patient on Stephanie Golden as above -- Patient to prioritize lifestyle interventions including balanced diet and incorporating daily exercise; can consider transition to more weight neutral  antipsychotic if indicated or switch to metformin in combination with Zyprexa  # Medication monitoring Interventions: -- Updated Stephanie Golden and lipid profile ordered today  Patient was given contact information for behavioral health clinic and was instructed to call 911 for emergencies.   Subjective:  Chief Complaint:  Chief Complaint  Patient presents with   Medication Management    Interval History:   Last seen by Stephanie Golden on 09/03/21. At that time, managed on: Stephanie Golden (Stephanie Golden '5mg'$ /10 mg at bedtime During that visit, patient reported she had stopped medication due to issues taking while working third shift.  Due to report of irritability and experience of "faint voices" without medication, patient was restarted on Stephanie Golden at that time.  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   Mood has been "stable" and denies issues with anxiety. Notes when she is tired and doesn't take medication, may experience feelings of "haziness," confusion, and feeling jumpy. Will recognize these as signs of potentially impending AVH and resume medication before it reaches that point. Last time she experienced AVH was when she went to the hospital last year. Denies SI/HI.   No longer taking trazodone and denies any issues sleeping; sleeping about 4-5 hours nightly during the week and feels this is typical for her. Over weekends, will catch up more on sleep.   Discussed importance of medication adherence given history of severe psychosis and strategies to improve medication adherence. She expresses intent to increase frequency she is taking her medication.   States since being on Stephanie Golden, weight  gain slowed down but has had difficulty losing weight. Notes continued increase appetite. Goes to  the gym twice weekly. Discussed options for eating more balanced diet and incorporating exercise on non-gym days.   She expresses interest in medications that have LAI equivalent so that she would not have to worry about taking medication daily. Discussed Stephanie Golden as a more weight-neutral option with LAI every 1-2 months.  She inquires about LAI given every 6 months and discussed option of Stephanie Golden and potential that LAI could be extended to 6 months but that this would depend on efficacy as well as require working up to 6 month interval. She would like to continue thinking about these options as she worries about having to get an injection once monthly initially.  Amenable to continuing medications as prescribed for time being while prioritizing lifestyle measures; amenable to obtaining updated labs today.  Visit Diagnosis:    ICD-10-CM   1. High risk medication use  Z79.899 Lipid Profile    HgB A1c    2. Psychotic disorder with hallucinations (Stephanie Golden)  F29     3. Psychosis, unspecified psychosis type (Stephanie Golden)  F29 Stephanie Golden    Stephanie D (25 hydroxy)      Past Psychiatric History:  Diagnoses: unspecified psychotic disorder (first diagnosed in Feb 2022) Medication trials: olanzapine, trazodone Hospitalizations: presented to ED 09/13/20 under IVC with AH Substance use:   -- Etoh: socially  -- Tobacco: Black and Milds  -- Denies illicit drug use  Past Medical History:  Past Medical History:  Diagnosis Date   H/O candidiasis 02/22/11   yeast   H/O rubella 02/22/11   yeast   H/O varicella    No pertinent past medical history     Past Surgical History:  Procedure Laterality Date   CESAREAN SECTION  08/07/2011   Procedure: CESAREAN SECTION;  Surgeon: Ena Dawley, MD;  Location: [facility];  Service: Gynecology;  Laterality: N/A;  Primary cesarean section with delivery of baby girl at 101. Apgars 9/9.   stitches to r eye      Family Psychiatric History:  Uncle (maternal): may have  schizophrenia  Family History:  Family History  Problem Relation Age of Onset   Drug abuse Father    Diabetes Maternal Uncle    Hypertension Maternal Uncle    Cancer Paternal Aunt    Cancer Maternal Grandmother    Heart disease Paternal Grandmother    Hypertension Paternal Grandmother    Diabetes Paternal Grandmother    Hypertension Maternal Grandfather    Anesthesia problems Neg Hx    Hypotension Neg Hx    Malignant hyperthermia Neg Hx    Pseudochol deficiency Neg Hx     Social History:  Social History   Socioeconomic History   Marital status: Single    Spouse name: Not on file   Number of children: Not on file   Years of education: Not on file   Highest education level: Not on file  Occupational History   Not on file  Tobacco Use   Smoking status: Former    Packs/day: 0.25    Types: Cigarettes   Smokeless tobacco: Never  Substance and Sexual Activity   Alcohol use: No   Drug use: No   Sexual activity: Yes    Birth control/protection: None  Other Topics Concern   Not on file  Social History Narrative   Not on file   Social Determinants of Health   Financial Resource Strain: Not on file  Food Insecurity: Not  on file  Transportation Needs: Not on file  Physical Activity: Not on file  Stress: Not on file  Social Connections: Not on file    Allergies: No Known Allergies  Current Medications: Current Outpatient Medications  Medication Sig Dispense Refill   Stephanie Golden (Stephanie Golden) 5-10 MG TABS Take 1 tablet by mouth once nightly at bedtime. 30 tablet 2   traZODone (DESYREL) 50 MG tablet Take 1 tablet (50 mg total) by mouth at bedtime. (Patient not taking: Reported on 11/16/2021) 30 tablet 2   No current facility-administered medications for this visit.    ROS: Endorses chronic knee pain; occasional headaches  Objective:  Psychiatric Specialty Exam: Blood pressure (!) 143/93, pulse 67, weight 188 lb (85.3 kg), SpO2 100 %.Body mass index is  33.3 kg/m.  General Appearance: Casual and Well Groomed  Eye Contact:  Good  Speech:  Clear and Coherent and Normal Rate  Volume:  Normal  Mood:   "good"  Affect:  Appropriate and Euthymic  Thought Content:  Denies AVH; paranoia; IOR    Suicidal Thoughts:  No  Homicidal Thoughts:  No  Thought Process:  Goal Directed and Linear  Orientation:  Full (Time, Place, and Person)    Memory:   Grossly intact  Judgment:  Good  Insight:  Fair  Concentration:  Concentration: Good  Recall:  NA  Fund of Knowledge: Good  Language: Good  Psychomotor Activity:  Normal  Akathisia:  No  AIMS (if indicated): not done  Assets:  Communication Skills Desire for Improvement Housing Leisure Time [place] Talents/Skills Transportation Vocational/Educational  ADL's:  Intact  Cognition: WNL  Sleep:  Fair   PE: General: well-appearing; no acute distress  Pulm: no increased work of breathing on room air  Strength & Muscle Tone: within normal limits Neuro: no focal neurological deficits observed  Gait & Station: normal  Metabolic Disorder Labs: Lab Results  Component Value Date   Stephanie Golden 5.7 (H) 11/16/2021   No results found for: "PROLACTIN" Lab Results  Component Value Date   CHOL 184 11/16/2021   TRIG 85 11/16/2021   HDL 44 11/16/2021   CHOLHDL 4.2 11/16/2021   LDLCALC 124 (H) 11/16/2021   Lab Results  Component Value Date   Stephanie Golden 1.570 11/16/2021   Wt Readings from Last 3 Encounters:  11/16/21 188 lb (85.3 kg)  09/03/21 182 lb (82.6 kg)  07/02/21 185 lb (83.9 kg)  \ Therapeutic Level Labs: No results found for: "LITHIUM" No results found for: "VALPROATE" No results found for: "CBMZ"  Screenings: GAD-7    Flowsheet Row Office Visit from 09/03/2021 in [facility] Office Visit from 07/02/2021 in [facility] Office Visit from 04/30/2021 in [facility] Office  Visit from 03/03/2021 in [place] from 12/30/2020 in [facility]  Total GAD-7 Score '9 5 6 6 10      '$ PHQ2-9    [place] Office Visit from 09/03/2021 in [facility] Office Visit from 07/02/2021 in [facility] Office Visit from 04/30/2021 in [facility] Office Visit from 03/03/2021 in [place] from 12/30/2020 in [facility]  PHQ-2 Total Score '5 2 2 2 2  '$ PHQ-9 Total Score '13 4 8 6 10      '$ [place] Office Visit from 09/03/2021 in [facility] Office Visit  from 07/02/2021 in [facility] Office Visit from 04/30/2021 in [place] No Risk No Risk No Risk       Collaboration of Care: Collaboration of Care: Medication Management AEB ongoing medication management and Psychiatrist AEB established with this provider  Patient/Guardian was advised Release of Information must be obtained prior to any record release in order to collaborate their care with an outside provider. Patient/Guardian was advised if they have not already done so to contact the registration department to sign all necessary forms in order for [place] to release information regarding their care.   Consent: Patient/Guardian gives verbal consent for treatment and assignment of benefits for services provided during this visit. Patient/Guardian expressed understanding and agreed to proceed.   A total of 45 minutes was spent involved in face to face clinical care, chart review, documentation, diet/exercise counseling, and medication management.   [facility]  11/16/21

## 2021-11-16 ENCOUNTER — Ambulatory Visit (INDEPENDENT_AMBULATORY_CARE_PROVIDER_SITE_OTHER): Payer: No Payment, Other | Admitting: Psychiatry

## 2021-11-16 ENCOUNTER — Encounter (HOSPITAL_COMMUNITY): Payer: Self-pay | Admitting: Psychiatry

## 2021-11-16 ENCOUNTER — Other Ambulatory Visit (HOSPITAL_COMMUNITY): Payer: Self-pay | Admitting: Psychiatry

## 2021-11-16 VITALS — BP 143/93 | HR 67 | Wt 188.0 lb

## 2021-11-16 DIAGNOSIS — F29 Unspecified psychosis not due to a substance or known physiological condition: Secondary | ICD-10-CM | POA: Diagnosis not present

## 2021-11-16 DIAGNOSIS — Z79899 Other long term (current) drug therapy: Secondary | ICD-10-CM | POA: Diagnosis not present

## 2021-11-17 LAB — LIPID PANEL
Chol/HDL Ratio: 4.2 ratio (ref 0.0–4.4)
Cholesterol, Total: 184 mg/dL (ref 100–199)
HDL: 44 mg/dL (ref >39)
LDL Chol Calc (NIH): 124 mg/dL — ABNORMAL HIGH (ref 0–99)
Triglycerides: 85 mg/dL (ref 0–149)
VLDL Cholesterol Cal: 16 mg/dL (ref 5–40)

## 2021-11-17 LAB — TSH: TSH: 1.57 u[IU]/mL (ref 0.450–4.500)

## 2021-11-17 LAB — HEMOGLOBIN A1C
Est. average glucose Bld gHb Est-mCnc: 117 mg/dL
Hgb A1c MFr Bld: 5.7 % — ABNORMAL HIGH (ref 4.8–5.6)

## 2021-11-17 LAB — VITAMIN D 25 HYDROXY (VIT D DEFICIENCY, FRACTURES): Vit D, 25-Hydroxy: 12.8 ng/mL — ABNORMAL LOW (ref 30.0–100.0)

## 2021-11-17 LAB — SPECIMEN STATUS REPORT

## 2021-11-26 ENCOUNTER — Other Ambulatory Visit: Payer: Self-pay

## 2021-12-03 ENCOUNTER — Other Ambulatory Visit: Payer: Self-pay

## 2022-01-25 ENCOUNTER — Ambulatory Visit (INDEPENDENT_AMBULATORY_CARE_PROVIDER_SITE_OTHER): Payer: No Payment, Other | Admitting: Student in an Organized Health Care Education/Training Program

## 2022-01-25 ENCOUNTER — Encounter (HOSPITAL_COMMUNITY): Payer: No Payment, Other | Admitting: Psychiatry

## 2022-01-25 ENCOUNTER — Other Ambulatory Visit: Payer: Self-pay

## 2022-01-25 ENCOUNTER — Encounter (HOSPITAL_COMMUNITY): Payer: Self-pay | Admitting: Student in an Organized Health Care Education/Training Program

## 2022-01-25 VITALS — BP 146/95 | HR 75 | Resp 20 | Wt 193.0 lb

## 2022-01-25 DIAGNOSIS — F200 Paranoid schizophrenia: Secondary | ICD-10-CM

## 2022-01-25 DIAGNOSIS — Z79899 Other long term (current) drug therapy: Secondary | ICD-10-CM

## 2022-01-25 DIAGNOSIS — F29 Unspecified psychosis not due to a substance or known physiological condition: Secondary | ICD-10-CM | POA: Diagnosis not present

## 2022-01-25 MED ORDER — LYBALVI 5-10 MG PO TABS
5.0000 mg | ORAL_TABLET | Freq: Every day | ORAL | 2 refills | Status: DC
Start: 2022-01-25 — End: 2022-04-12
  Filled 2022-01-25 – 2022-02-14 (×2): qty 30, 30d supply, fill #0

## 2022-01-25 NOTE — Progress Notes (Signed)
[place] MD/PA/NP OP Progress Note  01/25/2022 1:50 PM Stephanie Golden  MRN:  [id number]  Chief Complaint:  Chief Complaint  Patient presents with   Follow-up   HPI:  Stephanie Golden is a 37 y.o. female with a history of unspecified psychotic disorder who is an established patient with [facility] for management of symptoms of psychosis.  Patient endorses compliance with her Lybavi 5-'10mg'$  nightly.  Patient reports that she has not required trazodone 50 mg nightly for sleep.  Patient reports that she has been sleeping well.  Patient and provider review patient's labs done as well as patient's weight.  Patient reports that she has been a bit more sedentary the last 2 months, but denies feeling dysphoric over the last 2 months.  Patient reports "it was probably happy weight."  Patient reports she would like to implement lifestyle changes and return to the gym.  Patient reports she has already started going back to the gym in the last week or so.  Patient reports that she does feel benefit with her current medications as she no longer feels her auditory hallucinations and paranoia are as strong as approximately 2 years ago when she had her first psychotic break.  Patient reports that she still occasionally will hear the voices but they are no longer as "loud" and they are less identifiable.  Patient reports she also feels more in control and is able to switch her mind towards a more positive mindset.  Patient reports that the thoughts and voices will often try to lead her to a negative mindset.  Patient reports that she feels her mood has been "stable" as has her appetite.  Patient denies having any SI, HI or VH.  Patient reports she is no longer having the same impulsivity that she had when she was not compliant with her medications.  Patient reports she is less hypervigilant, and less concerned that someone is trying to manipulate her.  Patient reports that overall things are going well in  her life and she is not interested in increasing the olanzapine dose of her lobotomy as she feels that the current dose is a bit sedating.  Patient reports she is also still not interested in changing to the monthly LAI at this time, but she will continue to take this into account.   Visit Diagnosis:    ICD-10-CM   1. Paranoid schizophrenia ([place])  F20.0     2. Psychotic disorder with hallucinations (HCC)  F29 OLANZapine-Samidorphan (LYBALVI) 5-10 MG TABS    3. High risk medication use  Z79.899       Past Psychiatric History:   Diagnoses: unspecified psychotic disorder (first diagnosed in Feb 2022) Medication trials: olanzapine, trazodone Hospitalizations: presented to ED 09/13/20 under IVC with [facility] Substance use:              -- Etoh: socially             -- Tobacco: Black and Milds             -- Denies illicit drug use Past Medical History:  Past Medical History:  Diagnosis Date   H/O candidiasis 02/22/11   yeast   H/O rubella 02/22/11   yeast   H/O varicella    No pertinent past medical history     Past Surgical History:  Procedure Laterality Date   CESAREAN SECTION  08/07/2011   Procedure: CESAREAN SECTION;  Surgeon: Ena Dawley, MD;  Location: [facility];  Service: Gynecology;  Laterality: N/A;  Primary cesarean section with delivery of baby girl at 65. Apgars 9/9.   stitches to r eye      Family Psychiatric History:  Maternal uncle: Schizophrenia, diagnosed in his early 11s as well.  He was able to live alone for some time, unfortunately was not very compliant and is currently living in a group home like facility.  Family History:  Family History  Problem Relation Age of Onset   Drug abuse Father    Diabetes Maternal Uncle    Hypertension Maternal Uncle    Cancer Paternal Aunt    Cancer Maternal Grandmother    Heart disease Paternal Grandmother    Hypertension Paternal Grandmother    Diabetes Paternal Grandmother    Hypertension Maternal Grandfather     Anesthesia problems Neg Hx    Hypotension Neg Hx    Malignant hyperthermia Neg Hx    Pseudochol deficiency Neg Hx     Social History:  Social History   Socioeconomic History   Marital status: Single    Spouse name: Not on file   Number of children: Not on file   Years of education: Not on file   Highest education level: Not on file  Occupational History   Not on file  Tobacco Use   Smoking status: Former    Packs/day: 0.25    Types: Cigarettes   Smokeless tobacco: Never  Substance and Sexual Activity   Alcohol use: No   Drug use: No   Sexual activity: Yes    Birth control/protection: None  Other Topics Concern   Not on file  Social History Narrative   Not on file   Social Determinants of Health   Financial Resource Strain: Not on file  Food Insecurity: Not on file  Transportation Needs: Not on file  Physical Activity: Not on file  Stress: Not on file  Social Connections: Not on file    Allergies: No Known Allergies  Metabolic Disorder Labs: Lab Results  Component Value Date   HGBA1C 5.7 (H) 11/16/2021   No results found for: "PROLACTIN" Lab Results  Component Value Date   CHOL 184 11/16/2021   TRIG 85 11/16/2021   HDL 44 11/16/2021   CHOLHDL 4.2 11/16/2021   LDLCALC 124 (H) 11/16/2021   Lab Results  Component Value Date   TSH 1.570 11/16/2021    Therapeutic Level Labs: No results found for: "LITHIUM" No results found for: "VALPROATE" No results found for: "CBMZ"  Current Medications: Current Outpatient Medications  Medication Sig Dispense Refill   OLANZapine-Samidorphan (LYBALVI) 5-10 MG TABS Take 1 tablet by mouth once nightly at bedtime. 30 tablet 2   traZODone (DESYREL) 50 MG tablet Take 1 tablet (50 mg total) by mouth at bedtime. (Patient not taking: Reported on 11/16/2021) 30 tablet 2   No current facility-administered medications for this visit.     Musculoskeletal: Strength & Muscle Tone: within normal limits Gait & Station:  normal Patient leans: N/A  Psychiatric Specialty Exam: Review of Systems  Psychiatric/Behavioral:  Positive for hallucinations. Negative for agitation, behavioral problems, dysphoric mood, sleep disturbance and suicidal ideas.     Blood pressure (!) 146/95, pulse 75, resp. rate 20, weight 193 lb (87.5 kg), SpO2 98 %.Body mass index is 34.19 kg/m.  General Appearance: Fairly Groomed  Eye Contact:  Good  Speech:  Clear and Coherent  Volume:  Normal  Mood:  Euthymic  Affect:  Appropriate  Thought Process:  Coherent  Orientation:  Full (Time, Place, and Person)  Thought Content: Logical and Hallucinations: Auditory able to reality test   Suicidal Thoughts:  No  Homicidal Thoughts:  No  Memory:  Immediate;   Good Remote;   Good  Judgement:  Good  Insight:  Fair  Psychomotor Activity:  Decreased small movements appropriate, but patient did walk slow may be her baseline as it did not appear overtly odd rather almost as if patient was moving at her own pace  Concentration:  Concentration: Good  Recall:  NA  Fund of Knowledge: Good  Language: Good  Akathisia:  No  Handed:    AIMS (if indicated): not done  Assets:  Communication Skills Desire for Improvement Housing Intimacy Resilience Social Support  ADL's:  Intact  Cognition: WNL  Sleep:  Good   Screenings: GAD-7    Physiological scientist Office Visit from 09/03/2021 in [facility] Office Visit from 07/02/2021 in [facility] Office Visit from 04/30/2021 in [facility] Office Visit from 03/03/2021 in [place] from 12/30/2020 in [facility]  Total GAD-7 Score '9 5 6 6 10      '$ PHQ2-9    [place] Office Visit from 09/03/2021 in [facility] Office Visit from 07/02/2021 in [facility] Office Visit from 04/30/2021 in  [facility] Office Visit from 03/03/2021 in [place] from 12/30/2020 in [facility]  PHQ-2 Total Score '5 2 2 2 2  '$ PHQ-9 Total Score '13 4 8 6 10      '$ [place] Office Visit from 09/03/2021 in [facility] Office Visit from 07/02/2021 in [facility] Office Visit from 04/30/2021 in [place] No Risk No Risk No Risk        Assessment and Plan: SHERRINA ZAUGG is a 37 y.o. female with a history of unspecified psychotic disorder.  Based on assessment today, patient's labs and weight continue to indicate that she may be suffering from metabolic syndrome despite being on Lybalvi.  A sizable portion of today's assessment was spent educating patient on metabolic syndrome and the increased risk for this while taking olanzapine.  At this time patient like to implement life changing modifications to address weight gain and abnormal labs.  Interestingly, patient appears to have a good prognosis as she is high functioning and is able to delineate in reality check when she is having auditory hallucinations, and this is likely assisted with patient being on olanzapine at 5 mg; however patient would likely benefit from increased dose or an SGA with 10 mg of olanzapine equivalent dosing.  However as patient is already experiencing symptoms of metabolic syndrome as well as being concerned about being over sedated, did not make adjustments.  Overall patient is not decompensating and appears to be in a place where she is able to function day to day at the current dose.  Based off discussion today, patient spent a lot of time talking about her initial psychotic break as well as the last 2 years since then.  Discussion indicates that patient likely has paranoid schizophrenia.  Patient indicated feeling  hypervigilant with significant impulsivity/urges, concerned that someone was out to get her as well as having auditory and visual hallucinations at the initial psychotic break.  Patient continues to be concerned about the hypervigilant feelings and  impulsivity that would reemerge when she was less compliant.  Patient also continues to have auditory hallucinations of being on her current medication regimen whereas, more paranoid feelings have ceased at the current dose.  Patient also made it very clear that she does not partake in any substance use and has not years.  Patient also made it clear that she did not have any substances found in her system during her initial psychotic break.  Plan:   # Paranoid schizophrenia Past medication trials:  Status of problem: stable Interventions: -- Continue Lybalvi 5-10 mg nightly -- TSH (nml) and Vitamin D (low 12.8 recommended supplement) -- Lipid (elev LDL 124), Hgb A1c 5.7 -- Recommended AIMS at next visit   # Weight gain on antipsychotic Past medication trials: none prior Status of problem: not improving as expected Interventions: -- Patient on Lybalvi as above -- Patient to prioritize lifestyle interventions including balanced diet and incorporating daily exercise; can consider transition to more weight neutral antipsychotic if indicated or switch to metformin in combination with Zyprexa   # Medication monitoring Interventions: -- Recommend lifestyle changes such exercise and decreasing sugary drinks  Collaboration of Care: Collaboration of Care:   Patient/Guardian was advised Release of Information must be obtained prior to any record release in order to collaborate their care with an outside provider. Patient/Guardian was advised if they have not already done so to contact the registration department to sign all necessary forms in order for [place] to release information regarding their care.   Consent: Patient/Guardian gives verbal consent for  treatment and assignment of benefits for services provided during this visit. Patient/Guardian expressed understanding and agreed to proceed.   PGY-3 Freida Busman, MD 01/25/2022, 1:50 PM

## 2022-01-26 ENCOUNTER — Other Ambulatory Visit: Payer: Self-pay

## 2022-01-31 ENCOUNTER — Other Ambulatory Visit: Payer: Self-pay

## 2022-02-02 ENCOUNTER — Other Ambulatory Visit: Payer: Self-pay

## 2022-02-03 ENCOUNTER — Other Ambulatory Visit: Payer: Self-pay

## 2022-02-14 ENCOUNTER — Other Ambulatory Visit: Payer: Self-pay

## 2022-03-06 ENCOUNTER — Ambulatory Visit (INDEPENDENT_AMBULATORY_CARE_PROVIDER_SITE_OTHER): Payer: Self-pay

## 2022-03-06 ENCOUNTER — Ambulatory Visit
Admission: RE | Admit: 2022-03-06 | Discharge: 2022-03-06 | Disposition: A | Discharge status: Home / Self Care | Payer: Worker's Compensation | Source: Ambulatory Visit | Attending: Emergency Medicine | Admitting: Emergency Medicine

## 2022-03-06 VITALS — BP 115/76 | HR 84 | Temp 97.7°F | Resp 18

## 2022-03-06 DIAGNOSIS — W19XXXA Unspecified fall, initial encounter: Secondary | ICD-10-CM

## 2022-03-06 DIAGNOSIS — M25551 Pain in right hip: Secondary | ICD-10-CM | POA: Diagnosis not present

## 2022-03-06 DIAGNOSIS — M25521 Pain in right elbow: Secondary | ICD-10-CM | POA: Diagnosis not present

## 2022-03-06 DIAGNOSIS — Z3202 Encounter for pregnancy test, result negative: Secondary | ICD-10-CM | POA: Diagnosis not present

## 2022-03-06 HISTORY — DX: Schizophrenia, unspecified: F20.9

## 2022-03-06 LAB — POCT URINE PREGNANCY: Preg Test, Ur: NEGATIVE
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
## 2022-03-06 NOTE — ED Triage Notes (Signed)
Patient to Urgent Care with complaints of right sided hip and arm pain after injury that occurred 2/21. Denies hitting head or LOC.   Patient fell approx 2 feet at work off a stacking table. Patient reports she broke her fall with her right elbow and right hip.

## 2022-03-06 NOTE — Discharge Instructions (Addendum)
Take Tylenol or ibuprofen as directed.  Rest and elevate your elbow and hip.  Apply ice packs 2-3 times a day for up to 15 minutes each.      Follow up with an orthopedist if your symptoms are not improving.

## 2022-04-06 NOTE — Progress Notes (Deleted)
[place] MD Outpatient Progress Note  11/16/21 3:21 PM Stephanie Golden  MRN:  [id number]  Assessment:  Stephanie Golden presents for follow-up evaluation in-person. Today, patient reports   --- intermittent adherence to [facility] but despite this denies any signs/sx of psychosis. She does endorse that on days without taking medication she may notice "warning signs" of impending psychosis such as feeling "hazy" or "jumpy" at which time she will resume medications. Discussed importance of daily adherence to medications; it appears that patient's third shift job and concern for weight gain are primary barriers to consistent use. Discussed taking medication after returning from job prior to going to sleep; also introduced more weight neutral antipsychotic options (specifically Abilify as patient identifies desire for LAI - however she specifically desires to be on Q83month LAI which would only be possible with Hafyera and patient expressed hesitancy to have to build up to this dosing interval). She elected to remain on current medications for time being and continue to consider options while she prioritizes behavioral modifications for weight loss. Will obtain updated labs as below.  Plan to RTC in 2 months in person.   Identifying Information: Stephanie Golden is a 37 y.o. female with a history of schizophrenia who is an established patient with [place] [facility] for management of symptoms of psychosis.   Plan:  # Schizophrenia Past medication trials:  Status of problem: stable Interventions: -- Continue Lybalvi 5-10 mg nightly  # Weight gain on antipsychotic: Zyprexa started Feb 2022; weight gain from 153 lbs July 2022 -> 193 lbs Jan 2024 Past medication trials: none prior Status of problem: not improving as expected Interventions: -- Patient on Lybalvi as above -- Patient to prioritize lifestyle interventions including balanced diet and incorporating daily exercise; can consider  transition to more weight neutral antipsychotic if indicated or switch to metformin in combination with Zyprexa ***  # Medication monitoring Interventions: -- Hgb A1c 5.7 (prediabetes); lipid profile 11/16/21 revealing for elevated LDL   # Vitamin D deficiency Status of problem: *** Interventions: -- Vitamin D 12.8 11/16/21 -- Continue Vitamin D3 *** daily  Patient was given contact information for behavioral health clinic and was instructed to call 911 for emergencies.   Subjective:  Chief Complaint:  No chief complaint on file.   Interval History:   Med adherence Mood, anxiety Voices, paranoia, feeling on edge Weight, returning to gym AIMS    Visit Diagnosis:  No diagnosis found.   Past Psychiatric History:  Diagnoses: unspecified psychotic disorder (first diagnosed in Feb 2022) Medication trials: olanzapine, trazodone Hospitalizations: presented to ED 09/13/20 under IVC with AH Substance use:   -- Etoh: socially  -- Tobacco: Black and Milds  -- Denies illicit drug use  Past Medical History:  Past Medical History:  Diagnosis Date   H/O candidiasis 02/22/2011   yeast   H/O rubella 02/22/2011   yeast   H/O varicella    No pertinent past medical history    Schizophrenia ([place])     Past Surgical History:  Procedure Laterality Date   CESAREAN SECTION  08/07/2011   Procedure: CESAREAN SECTION;  Surgeon: Ena Dawley, MD;  Location: [facility];  Service: Gynecology;  Laterality: N/A;  Primary cesarean section with delivery of baby girl at 72. Apgars 9/9.   stitches to r eye      Family Psychiatric History:  Uncle (maternal): Schizophrenia, diagnosed in his early 90s as well. He was able to live alone for some time, unfortunately was not very  compliant and is currently living in a group home like facility.   Family History:  Family History  Problem Relation Age of Onset   Drug abuse Father    Diabetes Maternal Uncle    Hypertension Maternal Uncle    Cancer  Paternal Aunt    Cancer Maternal Grandmother    Heart disease Paternal Grandmother    Hypertension Paternal Grandmother    Diabetes Paternal Grandmother    Hypertension Maternal Grandfather    Anesthesia problems Neg Hx    Hypotension Neg Hx    Malignant hyperthermia Neg Hx    Pseudochol deficiency Neg Hx     Social History:  Social History   Socioeconomic History   Marital status: Single    Spouse name: Not on file   Number of children: Not on file   Years of education: Not on file   Highest education level: Not on file  Occupational History   Not on file  Tobacco Use   Smoking status: Former    Packs/day: .25    Types: Cigarettes   Smokeless tobacco: Never  Substance and Sexual Activity   Alcohol use: No   Drug use: No   Sexual activity: Yes    Birth control/protection: None  Other Topics Concern   Not on file  Social History Narrative   Not on file   Social Determinants of Health   Financial Resource Strain: Not on file  Food Insecurity: Not on file  Transportation Needs: Not on file  Physical Activity: Not on file  Stress: Not on file  Social Connections: Not on file    Allergies: No Known Allergies  Current Medications: Current Outpatient Medications  Medication Sig Dispense Refill   OLANZapine-Samidorphan (LYBALVI) 5-10 MG TABS Take 1 tablet by mouth once nightly at bedtime. 30 tablet 2   traZODone (DESYREL) 50 MG tablet Take 1 tablet (50 mg total) by mouth at bedtime. (Patient not taking: Reported on 11/16/2021) 30 tablet 2   No current facility-administered medications for this visit.    ROS: Endorses chronic knee pain; occasional headaches ***  Objective:  Psychiatric Specialty Exam: There were no vitals taken for this visit.There is no height or weight on file to calculate BMI.  General Appearance: Casual and Well Groomed  Eye Contact:  Good  Speech:  Clear and Coherent and Normal Rate  Volume:  Normal  Mood:   "good"  Affect:   Appropriate and Euthymic  Thought Content:  Denies AVH; paranoia; IOR    Suicidal Thoughts:  No  Homicidal Thoughts:  No  Thought Process:  Goal Directed and Linear  Orientation:  Full (Time, Place, and Person)    Memory:   Grossly intact  Judgment:  Good  Insight:  Fair  Concentration:  Concentration: Good  Recall:  NA  Fund of Knowledge: Good  Language: Good  Psychomotor Activity:  Normal  Akathisia:  No  AIMS (if indicated): not done  Assets:  Communication Skills Desire for Improvement Housing Leisure Time Physical Health Resilience Social Support Talents/Skills Transportation Vocational/Educational  ADL's:  Intact  Cognition: WNL  Sleep:  Fair   PE: General: well-appearing; no acute distress  Pulm: no increased work of breathing on room air  Strength & Muscle Tone: within normal limits Neuro: no focal neurological deficits observed  Gait & Station: normal  Metabolic Disorder Labs: Lab Results  Component Value Date   HGBA1C 5.7 (H) 11/16/2021   No results found for: "PROLACTIN" Lab Results  Component Value Date  CHOL 184 11/16/2021   TRIG 85 11/16/2021   HDL 44 11/16/2021   CHOLHDL 4.2 11/16/2021   LDLCALC 124 (H) 11/16/2021   Lab Results  Component Value Date   TSH 1.570 11/16/2021   Wt Readings from Last 3 Encounters:  01/25/22 193 lb (87.5 kg)  11/16/21 188 lb (85.3 kg)  09/03/21 182 lb (82.6 kg)  \ Therapeutic Level Labs: No results found for: "LITHIUM" No results found for: "VALPROATE" No results found for: "CBMZ"  Screenings: GAD-7    Flowsheet Row Office Visit from 09/03/2021 in [facility] Office Visit from 07/02/2021 in [facility] Office Visit from 04/30/2021 in [facility] Office Visit from 03/03/2021 in [place] from 12/30/2020 in [facility]  Total GAD-7 Score 9 5 6 6 10        PHQ2-9    [place] Office Visit from 09/03/2021 in [facility] Office Visit from 07/02/2021 in [facility] Office Visit from 04/30/2021 in [facility] Office Visit from 03/03/2021 in [place] from 12/30/2020 in [place]  PHQ-2 Total Score 5 2 2 2 2   PHQ-9 Total Score 13 4 8 6 10       [place] ED from 03/06/2022 in [facility] from 09/03/2021 in [facility] Office Visit from 07/02/2021 in [place] No Risk No Risk No Risk       Collaboration of Care: Collaboration of Care: Medication Management AEB ongoing medication management and Psychiatrist AEB established with this provider  Patient/Guardian was advised Release of Information must be obtained prior to any record release in order to collaborate their care with an outside provider. Patient/Guardian was advised if they have not already done so to contact the registration department to sign all necessary forms in order for [place] to release information regarding their care.   Consent: Patient/Guardian gives verbal consent for treatment and assignment of benefits for services provided during this visit. Patient/Guardian expressed understanding and agreed to proceed.   A total of 45 minutes was spent involved in face to face clinical care, chart review, documentation, diet/exercise counseling, and medication management.   [facility] A  11/16/21

## 2022-04-12 ENCOUNTER — Encounter (HOSPITAL_COMMUNITY): Payer: No Payment, Other | Admitting: Psychiatry

## 2022-04-12 ENCOUNTER — Other Ambulatory Visit: Payer: Self-pay

## 2022-04-12 ENCOUNTER — Telehealth (HOSPITAL_COMMUNITY): Payer: Self-pay | Admitting: Student in an Organized Health Care Education/Training Program

## 2022-04-12 ENCOUNTER — Other Ambulatory Visit (HOSPITAL_COMMUNITY): Payer: Self-pay | Admitting: Student in an Organized Health Care Education/Training Program

## 2022-04-12 DIAGNOSIS — F29 Unspecified psychosis not due to a substance or known physiological condition: Secondary | ICD-10-CM

## 2022-04-12 MED ORDER — LYBALVI 5-10 MG PO TABS
5.0000 mg | ORAL_TABLET | Freq: Every day | ORAL | 0 refills | Status: DC
Start: 2022-04-12 — End: 2022-04-21
  Filled 2022-04-12: qty 10, 10d supply, fill #0

## 2022-04-12 NOTE — Telephone Encounter (Signed)
Sent supply to get her to her appointment

## 2022-04-15 ENCOUNTER — Other Ambulatory Visit: Payer: Self-pay

## 2022-04-20 NOTE — Progress Notes (Signed)
[facility] MD Outpatient Progress Note  11/16/21 2:03 PM Stephanie BonineBrittney L Kastelic  MRN:  [id number]  Assessment:  Stephanie Golden presents for follow-up evaluation in-person. Today, 04/20/22, patient reports   --- [facility] but despite this denies any signs/sx of psychosis. She does endorse that on days without taking medication she may notice "warning signs" of impending psychosis such as feeling "hazy" or "jumpy" at which time she will resume medications. Discussed importance of daily adherence to medications; it appears that patient's third shift job and concern for weight gain are primary barriers to consistent use. Discussed taking medication after returning from job prior to going to sleep; also introduced more weight neutral antipsychotic options (specifically Abilify as patient identifies desire for LAI - however she specifically desires to be on Q1057month LAI which would only be possible with Hafyera and patient expressed hesitancy to have to build up to this dosing interval). She elected to remain on current medications for time being and continue to consider options while she prioritizes behavioral modifications for weight loss. Will obtain updated labs as below.  Plan to RTC in 2 months in person.   Identifying Information: Stephanie Golden is a 37 y.o. female with a history of schizophrenia who is an established patient with [facility] for management of symptoms of psychosis.   Plan:  # Schizophrenia Past medication trials:  Status of problem: stable Interventions: -- Continue Lybalvi 5-10 mg nightly  # Weight gain on antipsychotic: Zyprexa started Feb 2022; weight gain from 153 lbs July 2022 -> 193 lbs Jan 2024 Past medication trials: none prior Status of problem: not improving as expected Interventions: -- Patient on Lybalvi as above -- Patient to prioritize lifestyle interventions including balanced diet and incorporating daily exercise; can  consider transition to more weight neutral antipsychotic if indicated or switch to metformin in combination with Zyprexa ***  # Medication monitoring Interventions: -- Hgb A1c 5.7 (prediabetes); lipid profile 11/16/21 revealing for elevated LDL   # Vitamin D deficiency Status of problem: *** Interventions: -- Vitamin D 12.8 11/16/21 -- Continue Vitamin D3 *** daily  Patient was given contact information for behavioral health clinic and was instructed to call 911 for emergencies.   Subjective:  Chief Complaint:  No chief complaint on file.   Interval History:   Last seen by resident MD Dr. Morrie SheldonMcQuilla 01/25/22. At that time, no medication changes were made and extensive discussion was held regarding concern for metabolic syndrome on Lybalvi. Patient opted to implement lifestyle modifications and continue regimen at that time.    Lybalvi - need to obtain pharmacy assistance Consider abilify Med adherence Mood, anxiety Voices, paranoia, feeling on edge Weight, returning to gym AIMS    Visit Diagnosis:  No diagnosis found.   Past Psychiatric History:  Diagnoses: unspecified psychotic disorder (first diagnosed in Feb 2022) Medication trials: olanzapine, trazodone Hospitalizations: presented to ED 09/13/20 under IVC with [facility] Substance use:   -- Etoh: socially  -- Tobacco: Black and Milds  -- Denies illicit drug use  Past Medical History:  Past Medical History:  Diagnosis Date   H/O candidiasis 02/22/2011   yeast   H/O rubella 02/22/2011   yeast   H/O varicella    No pertinent past medical history    Schizophrenia ([facility])     Past Surgical History:  Procedure Laterality Date   CESAREAN SECTION  08/07/2011   Procedure: CESAREAN SECTION;  Surgeon: Kirkland HunArthur Stringer, MD;  Location: [facility];  Service: Gynecology;  Laterality: N/A;  Primary cesarean section with delivery of baby girl at 84. Apgars 9/9.   stitches to r eye      Family Psychiatric History:  Uncle (maternal):  Schizophrenia, diagnosed in his early 54s as well. He was able to live alone for some time, unfortunately was not very compliant and is currently living in a group home like facility.   Family History:  Family History  Problem Relation Age of Onset   Drug abuse Father    Diabetes Maternal Uncle    Hypertension Maternal Uncle    Cancer Paternal Aunt    Cancer Maternal Grandmother    Heart disease Paternal Grandmother    Hypertension Paternal Grandmother    Diabetes Paternal Grandmother    Hypertension Maternal Grandfather    Anesthesia problems Neg Hx    Hypotension Neg Hx    Malignant hyperthermia Neg Hx    Pseudochol deficiency Neg Hx     Social History:  Social History   Socioeconomic History   Marital status: Single    Spouse name: Not on file   Number of children: Not on file   Years of education: Not on file   Highest education level: Not on file  Occupational History   Not on file  Tobacco Use   Smoking status: Former    Packs/day: .25    Types: Cigarettes   Smokeless tobacco: Never  Substance and Sexual Activity   Alcohol use: No   Drug use: No   Sexual activity: Yes    Birth control/protection: None  Other Topics Concern   Not on file  Social History Narrative   Not on file   Social Determinants of Health   Financial Resource Strain: Not on file  Food Insecurity: Not on file  Transportation Needs: Not on file  Physical Activity: Not on file  Stress: Not on file  Social Connections: Not on file    Allergies: No Known Allergies  Current Medications: Current Outpatient Medications  Medication Sig Dispense Refill   OLANZapine-Samidorphan (LYBALVI) 5-10 MG TABS Take 1 tablet by mouth once nightly at bedtime. 10 tablet 0   traZODone (DESYREL) 50 MG tablet Take 1 tablet (50 mg total) by mouth at bedtime. (Patient not taking: Reported on 11/16/2021) 30 tablet 2   No current facility-administered medications for this visit.    ROS: Endorses chronic  knee pain; occasional headaches ***  Objective:  Psychiatric Specialty Exam: There were no vitals taken for this visit.There is no height or weight on file to calculate BMI.  General Appearance: Casual and Well Groomed  Eye Contact:  Good  Speech:  Clear and Coherent and Normal Rate  Volume:  Normal  Mood:   "good"  Affect:  Appropriate and Euthymic  Thought Content:  Denies AVH; paranoia; IOR    Suicidal Thoughts:  No  Homicidal Thoughts:  No  Thought Process:  Goal Directed and Linear  Orientation:  Full (Time, Place, and Person)    Memory:   Grossly intact  Judgment:  Good  Insight:  Fair  Concentration:  Concentration: Good  Recall:  NA  Fund of Knowledge: Good  Language: Good  Psychomotor Activity:  Normal  Akathisia:  No  AIMS (if indicated): not done  Assets:  Communication Skills Desire for Improvement Housing Leisure Time Physical Health Resilience Social Support Talents/Skills Transportation Vocational/Educational  ADL's:  Intact  Cognition: WNL  Sleep:  Fair   PE: General: well-appearing; no acute distress  Pulm: no increased work of breathing  on room air  Strength & Muscle Tone: within normal limits Neuro: no focal neurological deficits observed  Gait & Station: normal  Metabolic Disorder Labs: Lab Results  Component Value Date   HGBA1C 5.7 (H) 11/16/2021   No results found for: "PROLACTIN" Lab Results  Component Value Date   CHOL 184 11/16/2021   TRIG 85 11/16/2021   HDL 44 11/16/2021   CHOLHDL 4.2 11/16/2021   LDLCALC 124 (H) 11/16/2021   Lab Results  Component Value Date   TSH 1.570 11/16/2021   Wt Readings from Last 3 Encounters:  01/25/22 193 lb (87.5 kg)  11/16/21 188 lb (85.3 kg)  09/03/21 182 lb (82.6 kg)  \ Therapeutic Level Labs: No results found for: "LITHIUM" No results found for: "VALPROATE" No results found for: "CBMZ"  Screenings: GAD-7    Flowsheet Row Office Visit from 09/03/2021 in [facility] Office Visit from 07/02/2021 in [facility] Office Visit from 04/30/2021 in [facility] Office Visit from 03/03/2021 in [facility] Clinical Support from 12/30/2020 in [facility]  Total GAD-7 Score 9 5 6 6 10       PHQ2-9    Flowsheet Row Office Visit from 09/03/2021 in [facility] Office Visit from 07/02/2021 in [facility] Office Visit from 04/30/2021 in [facility] Office Visit from 03/03/2021 in [facility] Clinical Support from 12/30/2020 in [facility]  PHQ-2 Total Score 5 2 2 2 2   PHQ-9 Total Score 13 4 8 6 10       Flowsheet Row ED from 03/06/2022 in [facility] Visit from 09/03/2021 in [facility] Office Visit from 07/02/2021 in [facility]  C-SSRS RISK CATEGORY No Risk No Risk No Risk       Collaboration of Care: Collaboration of Care: Medication Management AEB ongoing medication management and Psychiatrist AEB established with this provider  Patient/Guardian was advised Release of Information must be obtained prior to any record release in order to collaborate their care with an outside provider. Patient/Guardian was advised if they have not already done so to contact the registration department to sign all necessary forms in order for [place] to release information regarding their care.   Consent: Patient/Guardian gives verbal consent for treatment and assignment of benefits for services provided during this visit. Patient/Guardian expressed understanding and agreed to proceed.   A total of *** minutes was spent involved in face to face clinical care, chart review, documentation, diet/exercise counseling, and medication management.    [facility] A  04/20/22

## 2022-04-21 ENCOUNTER — Encounter (HOSPITAL_COMMUNITY): Payer: Self-pay | Admitting: Psychiatry

## 2022-04-21 ENCOUNTER — Ambulatory Visit (INDEPENDENT_AMBULATORY_CARE_PROVIDER_SITE_OTHER): Payer: No Payment, Other | Admitting: Psychiatry

## 2022-04-21 ENCOUNTER — Other Ambulatory Visit: Payer: Self-pay

## 2022-04-21 VITALS — BP 140/82 | HR 71 | Ht 63.0 in | Wt 189.0 lb

## 2022-04-21 DIAGNOSIS — F29 Unspecified psychosis not due to a substance or known physiological condition: Secondary | ICD-10-CM | POA: Diagnosis not present

## 2022-04-21 DIAGNOSIS — F200 Paranoid schizophrenia: Secondary | ICD-10-CM

## 2022-04-21 DIAGNOSIS — Z79899 Other long term (current) drug therapy: Secondary | ICD-10-CM | POA: Diagnosis not present

## 2022-04-21 MED ORDER — LYBALVI 5-10 MG PO TABS
1.0000 | ORAL_TABLET | Freq: Every day | ORAL | 2 refills | Status: AC
  Filled 2022-04-21: qty 30, fill #0

## 2022-04-21 MED ORDER — METFORMIN HCL ER 500 MG PO TB24
ORAL_TABLET | ORAL | 2 refills | Status: DC
  Filled 2022-04-21: qty 60, 30d supply, fill #0

## 2022-04-21 NOTE — Patient Instructions (Addendum)
Thank you for attending your appointment today.  -- START metformin 500 mg with breakfast for 1 week then INCREASE to 500 mg twice daily with food -- START Vitamin D3 1000 IU daily due to low vitamin D on labs; this can be obtained over the counter -- Continue other medications as prescribed.  Please do not make any changes to medications without first discussing with your provider. If you are experiencing a psychiatric emergency, please call 911 or present to your nearest emergency department. Additional crisis, medication management, and therapy resources are included below.  [facility]  [address] [place], [place] [address] [phone] WALK-IN URGENT CARE 24/7 FOR ANYONE [address], [place], [place]  [phone] Fax: [phone] [email or web address] *Interpreters available *Accepts all insurance and uninsured for Urgent Care needs *Accepts Medicaid and uninsured for outpatient treatment (below)      ONLY FOR [facility]  Below:    Outpatient New Patient Assessment/Therapy Walk-ins:        Monday -Thursday 8am until slots are full.        Every Friday 1pm-4pm  (first come, first served)                   New Patient Psychiatry/Medication Management        Monday-Friday 8am-11am (first come, first served)               For all walk-ins we ask that you arrive by 7:15am, because patients will be seen in the order of arrival.

## 2022-04-22 ENCOUNTER — Other Ambulatory Visit: Payer: Self-pay

## 2022-04-25 ENCOUNTER — Other Ambulatory Visit: Payer: Self-pay

## 2022-05-03 ENCOUNTER — Telehealth (HOSPITAL_COMMUNITY): Payer: Self-pay | Admitting: *Deleted

## 2022-05-03 NOTE — Telephone Encounter (Signed)
Fax sent to [employer] for prior authorization of Lybalvi, confirmation received, awaiting decision to be faxed with approval or denial.

## 2022-05-04 ENCOUNTER — Other Ambulatory Visit: Payer: Self-pay

## 2022-05-23 ENCOUNTER — Other Ambulatory Visit: Payer: Self-pay

## 2022-06-15 NOTE — Progress Notes (Unsigned)
BH MD Outpatient Progress Note  11/16/21 1:55 PM Stephanie Golden  MRN:  [id number]  Assessment:  Stephanie Golden presents for follow-up evaluation in-person. Today, 06/15/22, patient reports overall psychiatric stability. Although she endorses mild paranoid ideation that has been fairly chronic in nature since hospitalization, she reports she is able to fairly easily challenge these thoughts and remain reality-based. Denies AVH. Reports overall stability of mood and anxiety. Notes 5 lb weight loss this interval although identifies that she continues to have difficulty achieving desired weight loss despite diet/exercise changes. Discussed recommendation to switch to Abilify however after extensive discussion patient would prefer to remain on Lybalvi for time being with addition of metformin. Assisted patient in completing patient assistance paperwork for Lybalvi and she was provided with samples to bridge her until medication approval. No changes to plan of care at this time.  RTC in 2 months in person.  Identifying Information: Stephanie Golden is a 37 y.o. female with a history of schizophrenia who is an established patient with [facility] for management of symptoms of psychosis.   Plan:  # Schizophrenia Past medication trials: Zyprexa (weight gain) Status of problem: stable Interventions: -- Continue Lybalvi 5-10 mg nightly -- Assisted patient with filling out patient assistance program paperwork and was faxed to pharmaceutical company for approval today 04/21/22; provided with 28 days worth of samples to bridge while awaiting approval -- Extensively discussed option and recommendation to switch to more weight neutral antipsychotic such as Abilify however patient declined today  # Weight gain on antipsychotic: Zyprexa started Feb 2022; weight gain from 153 lbs July 2022 -> 193 lbs Jan 2024 -> 189 lbs 04/21/22 Past medication trials: none prior Status of problem: not  improving as expected Interventions: -- Patient on Lybalvi as above -- START metformin 500 mg qAM with breakfast for 1 week then INCREASE to 500 mg twice daily with meals -- Risks, benefits, and side effects including but not limited to GI upset were reviewed with informed consent provided -- Patient to continue lifestyle interventions including balanced diet and incorporating daily exercise  # Medication monitoring Interventions: -- Hgb A1c 5.7 (prediabetes); lipid profile 11/16/21 revealing for elevated LDL   # Vitamin D deficiency Status of problem: acute Interventions: -- Vitamin D 12.8 11/16/21 -- Continue Vitamin D3 1000 IU daily   Patient was given contact information for behavioral health clinic and was instructed to call 911 for emergencies.   Subjective:  Chief Complaint:  No chief complaint on file.   Interval History:   Lybalvi approved? Paranoia, AVH Mood, anxiety/thoughts of doom Weight, exercise Start of metformin  Consider Abilify   Visit Diagnosis:  No diagnosis found.   Past Psychiatric History:  Diagnoses: unspecified psychotic disorder (first diagnosed in Feb 2022) Medication trials: olanzapine, trazodone Hospitalizations: presented to ED 09/13/20 under IVC with [facility] Substance use:   -- Etoh: socially  -- Tobacco: Black and Milds  -- Denies illicit drug use  Past Medical History:  Past Medical History:  Diagnosis Date   H/O candidiasis 02/22/2011   yeast   H/O rubella 02/22/2011   yeast   H/O varicella    No pertinent past medical history    Schizophrenia (HCC)     Past Surgical History:  Procedure Laterality Date   CESAREAN SECTION  08/07/2011   Procedure: CESAREAN SECTION;  Surgeon: Stephanie Hun, MD;  Location: [facility];  Service: Gynecology;  Laterality: N/A;  Primary cesarean section with delivery of baby girl at 76.  Apgars 9/9.   stitches to r eye      Family Psychiatric History:  Uncle (maternal): Schizophrenia, diagnosed in  his early 71s as well. He was able to live alone for some time, unfortunately was not very compliant and is currently living in a group home facility.   Family History:  Family History  Problem Relation Age of Onset   Drug abuse Father    Diabetes Maternal Uncle    Hypertension Maternal Uncle    Cancer Paternal Aunt    Cancer Maternal Grandmother    Heart disease Paternal Grandmother    Hypertension Paternal Grandmother    Diabetes Paternal Grandmother    Hypertension Maternal Grandfather    Anesthesia problems Neg Hx    Hypotension Neg Hx    Malignant hyperthermia Neg Hx    Pseudochol deficiency Neg Hx     Social History:  Social History   Socioeconomic History   Marital status: Single    Spouse name: Not on file   Number of children: Not on file   Years of education: Not on file   Highest education level: Not on file  Occupational History   Not on file  Tobacco Use   Smoking status: Former    Packs/day: .25    Types: Cigarettes   Smokeless tobacco: Never  Substance and Sexual Activity   Alcohol use: No   Drug use: No   Sexual activity: Yes    Birth control/protection: None  Other Topics Concern   Not on file  Social History Narrative   Not on file   Social Determinants of Health   Financial Resource Strain: Not on file  Food Insecurity: Not on file  Transportation Needs: Not on file  Physical Activity: Not on file  Stress: Not on file  Social Connections: Not on file    Allergies: No Known Allergies  Current Medications: Current Outpatient Medications  Medication Sig Dispense Refill   metFORMIN (GLUCOPHAGE-XR) 500 MG 24 hr tablet Take 1 tablet (500 mg) daily with breakfast for 1 week,  then INCREASE to 1 tablet (500 mg) twice daily with meals. 60 tablet 2   OLANZapine-Samidorphan (LYBALVI) 5-10 MG TABS Take 1 tablet by mouth at bedtime. 30 tablet 2   traZODone (DESYREL) 50 MG tablet Take 1 tablet (50 mg total) by mouth at bedtime. (Patient not taking:  Reported on 11/16/2021) 30 tablet 2   No current facility-administered medications for this visit.    ROS: Denies any physical complaints  Objective:  Psychiatric Specialty Exam: There were no vitals taken for this visit.There is no height or weight on file to calculate BMI.  General Appearance: Casual and Well Groomed  Eye Contact:  Good  Speech:  Clear and Coherent and Normal Rate  Volume:  Normal  Mood:   "good"  Affect:  Appropriate and Euthymic  Thought Content:  Denies AVH; IOR. Mild paranoid ideation however patient is able to fairly easily challenge these thoughts.    Suicidal Thoughts:  No  Homicidal Thoughts:  No  Thought Process:  Goal Directed and Linear  Orientation:  Full (Time, Place, and Person)    Memory:   Grossly intact  Judgment:  Good  Insight:  Fair  Concentration:  Concentration: Good  Recall:  NA  Fund of Knowledge: Good  Language: Good  Psychomotor Activity:  Normal  Akathisia:  No  AIMS (if indicated): not done  Assets:  Communication Skills Desire for Improvement Housing Leisure Time Physical Health Resilience Social Support Talents/Skills  Transportation Vocational/Educational  ADL's:  Intact  Cognition: WNL  Sleep:  Fair   PE: General: well-appearing; no acute distress  Pulm: no increased work of breathing on room air  Strength & Muscle Tone: within normal limits Neuro: no focal neurological deficits observed  Gait & Station: normal  Metabolic Disorder Labs: Lab Results  Component Value Date   HGBA1C 5.7 (H) 11/16/2021   No results found for: "PROLACTIN" Lab Results  Component Value Date   CHOL 184 11/16/2021   TRIG 85 11/16/2021   HDL 44 11/16/2021   CHOLHDL 4.2 11/16/2021   LDLCALC 124 (H) 11/16/2021   Lab Results  Component Value Date   TSH 1.570 11/16/2021   Wt Readings from Last 3 Encounters:  04/21/22 189 lb (85.7 kg)  01/25/22 193 lb (87.5 kg)  11/16/21 188 lb (85.3 kg)   Therapeutic Level Labs: No  results found for: "LITHIUM" No results found for: "VALPROATE" No results found for: "CBMZ"  Screenings: GAD-7    Flowsheet Row Office Visit from 09/03/2021 in [facility] Office Visit from 07/02/2021 in [facility] Office Visit from 04/30/2021 in [facility] Office Visit from 03/03/2021 in [facility] Clinical Support from 12/30/2020 in [facility]  Total GAD-7 Score 9 5 6 6 10       PHQ2-9    Flowsheet Row Office Visit from 09/03/2021 in [facility] Office Visit from 07/02/2021 in [facility] Office Visit from 04/30/2021 in [facility] Office Visit from 03/03/2021 in [facility] Clinical Support from 12/30/2020 in [facility]  PHQ-2 Total Score 5 2 2 2 2   PHQ-9 Total Score 13 4 8 6 10       Flowsheet Row ED from 03/06/2022 in [facility] Visit from 09/03/2021 in [facility] Office Visit from 07/02/2021 in [facility]  C-SSRS RISK CATEGORY No Risk No Risk No Risk       Collaboration of Care: Collaboration of Care: Medication Management AEB ongoing medication management and Psychiatrist AEB established with this provider  Patient/Guardian was advised Release of Information must be obtained prior to any record release in order to collaborate their care with an outside provider. Patient/Guardian was advised if they have not already done so to contact the registration department to sign all necessary forms in order for [place] to release information regarding their care.   Consent: Patient/Guardian gives verbal consent for treatment and assignment of benefits for services provided during this visit. Patient/Guardian expressed  understanding and agreed to proceed.   A total of *** minutes was spent involved in face to face clinical care, chart review, documentation, diet/exercise counseling, and medication management.   [facility] A  06/15/22

## 2022-06-16 ENCOUNTER — Encounter (HOSPITAL_COMMUNITY): Payer: Self-pay | Admitting: Psychiatry

## 2022-06-16 ENCOUNTER — Other Ambulatory Visit: Payer: Self-pay

## 2022-06-16 ENCOUNTER — Ambulatory Visit (INDEPENDENT_AMBULATORY_CARE_PROVIDER_SITE_OTHER): Payer: No Payment, Other | Admitting: Psychiatry

## 2022-06-16 DIAGNOSIS — F209 Schizophrenia, unspecified: Secondary | ICD-10-CM

## 2022-06-16 DIAGNOSIS — Z79899 Other long term (current) drug therapy: Secondary | ICD-10-CM | POA: Diagnosis not present

## 2022-06-16 MED ORDER — METFORMIN HCL ER 500 MG PO TB24
500.0000 mg | ORAL_TABLET | Freq: Two times a day (BID) | ORAL | 2 refills | Status: DC
  Filled 2022-06-16: qty 60, 30d supply, fill #0

## 2022-06-16 NOTE — Patient Instructions (Signed)
Thank you for attending your appointment today.  -- INCREASE metformin to 500 mg twice daily with food -- Continue other medications as prescribed.  Please do not make any changes to medications without first discussing with your provider. If you are experiencing a psychiatric emergency, please call 911 or present to your nearest emergency department. Additional crisis, medication management, and therapy resources are included below.  [facility]  [address], [place], [place] [address] [phone] WALK-IN URGENT CARE 24/7 FOR ANYONE [address] [place], [place]  [phone] Fax: [phone] [email or web address] *Interpreters available *Accepts all insurance and uninsured for Urgent Care needs *Accepts Medicaid and uninsured for outpatient treatment (below)      ONLY FOR [facility]  Below:    Outpatient New Patient Assessment/Therapy Walk-ins:        Monday -Thursday 8am until slots are full.        Every Friday 1pm-4pm  (first come, first served)                   New Patient Psychiatry/Medication Management        Monday-Friday 8am-11am (first come, first served)               For all walk-ins we ask that you arrive by 7:15am, because patients will be seen in the order of arrival.

## 2022-06-23 ENCOUNTER — Other Ambulatory Visit: Payer: Self-pay

## 2022-08-23 ENCOUNTER — Encounter (HOSPITAL_COMMUNITY): Payer: No Payment, Other | Admitting: Psychiatry
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
## 2022-10-14 ENCOUNTER — Encounter (HOSPITAL_COMMUNITY): Payer: No Payment, Other | Admitting: Psychiatry

## 2022-10-17 NOTE — Progress Notes (Deleted)
BH MD Outpatient Progress Note  11/16/21 4:16 PM Stephanie Golden  MRN:  [id number]  Assessment:  Stephanie Golden presents for follow-up evaluation in-person. Today, 10/17/22, patient reports continued psychiatric stability and denies signs/sx of psychosis or mood instability. Reports some initial side effects to metformin however these have since improved and she is amenable to further titration; patient with 9 lb weight loss thus far. Previously assisted patient in applying for medication assistance for Lybalvi however has not heard back regarding approval; will have clinic pharmacist look into this further. Discussed that if medication is not approved or if weight gain persists despite metformin will consider switch to Abilify.  RTC in 2 months in person.  Identifying Information: Stephanie Golden is a 37 y.o. female with a history of schizophrenia who is an established patient with [facility] for management of symptoms of psychosis.   Plan:  # Schizophrenia Past medication trials: Zyprexa (weight gain) Status of problem: stable Interventions: -- Continue Lybalvi 2.5-5 mg nightly -- Filled out patient assistance program paperwork and was faxed to pharmaceutical company for approval on 04/21/22; clinic pharmacist to look into approval status. Patient provided with samples today.  -- Extensively discussed option and recommendation to switch to more weight neutral antipsychotic such as Abilify however patient declined today  # Weight gain on antipsychotic: Zyprexa started Feb 2022; weight gain from 153 lbs July 2022 -> 193 lbs Jan 2024 -> 189 lbs 04/21/22 -> 180 lbs 06/16/22 Past medication trials: none prior Status of problem: improving Interventions: -- Patient on Lybalvi as above -- INCREASE metformin to 500 mg twice daily with meals -- Patient to continue lifestyle interventions including balanced diet and incorporating daily exercise  # Medication  monitoring Interventions: -- Hgb A1c 5.7 (prediabetes); lipid profile 11/16/21 revealing for elevated LDL   # Vitamin D deficiency Status of problem: acute Interventions: -- Vitamin D 12.8 11/16/21 -- Continue Vitamin D3 1000 IU daily   Patient was given contact information for behavioral health clinic and was instructed to call 911 for emergencies.   Subjective:  Chief Complaint:  No chief complaint on file.   Interval History:   *** Lybalvi 1/2 tablet Metformin increase Mood, psychosis   Visit Diagnosis:  No diagnosis found.    Past Psychiatric History:  Diagnoses: unspecified psychotic disorder (first diagnosed in Feb 2022) Medication trials: olanzapine, trazodone Hospitalizations: presented to ED 09/13/20 under IVC with [facility] Substance use:   -- Etoh: socially  -- Tobacco: Black and Milds cigars x2 daily  -- Denies illicit drug use  Past Medical History:  Past Medical History:  Diagnosis Date   H/O candidiasis 02/22/2011   yeast   H/O rubella 02/22/2011   yeast   H/O varicella    Involuntary commitment    No pertinent past medical history    Schizophrenia (HCC)     Past Surgical History:  Procedure Laterality Date   CESAREAN SECTION  08/07/2011   Procedure: CESAREAN SECTION;  Surgeon: Kirkland Hun, MD;  Location: [facility];  Service: Gynecology;  Laterality: N/A;  Primary cesarean section with delivery of baby girl at 72. Apgars 9/9.   stitches to r eye      Family Psychiatric History:  Uncle (maternal): Schizophrenia, diagnosed in his early 60s as well. He was able to live alone for some time, unfortunately was not very compliant and is currently living in a group home facility.   Family History:  Family History  Problem Relation Age of Onset  Drug abuse Father    Diabetes Maternal Uncle    Hypertension Maternal Uncle    Cancer Paternal Aunt    Cancer Maternal Grandmother    Heart disease Paternal Grandmother    Hypertension Paternal Grandmother     Diabetes Paternal Grandmother    Hypertension Maternal Grandfather    Anesthesia problems Neg Hx    Hypotension Neg Hx    Malignant hyperthermia Neg Hx    Pseudochol deficiency Neg Hx     Social History:  Social History   Socioeconomic History   Marital status: Single    Spouse name: Not on file   Number of children: Not on file   Years of education: Not on file   Highest education level: Not on file  Occupational History   Not on file  Tobacco Use   Smoking status: Every Day    Types: Cigars, Cigarettes   Smokeless tobacco: Never   Tobacco comments:    Smokes Black and Mild Cigars x2 daily  Substance and Sexual Activity   Alcohol use: No   Drug use: No   Sexual activity: Yes    Birth control/protection: None  Other Topics Concern   Not on file  Social History Narrative   Not on file   Social Determinants of Health   Financial Resource Strain: Not on file  Food Insecurity: Not on file  Transportation Needs: Not on file  Physical Activity: Not on file  Stress: Not on file  Social Connections: Not on file    Allergies: No Known Allergies  Current Medications: Current Outpatient Medications  Medication Sig Dispense Refill   metFORMIN (GLUCOPHAGE-XR) 500 MG 24 hr tablet Take 1 tablet (500 mg total) by mouth 2 (two) times daily with a meal. 60 tablet 2   No current facility-administered medications for this visit.    ROS: Denies any physical complaints  Objective:  Psychiatric Specialty Exam: There were no vitals taken for this visit.There is no height or weight on file to calculate BMI.  General Appearance: Casual and Well Groomed  Eye Contact:  Good  Speech:  Clear and Coherent and Normal Rate  Volume:  Normal  Mood:   "good"  Affect:  Appropriate and Euthymic  Thought Content:  Denies AVH; IOR. Denies paranoid ideation.    Suicidal Thoughts:  No  Homicidal Thoughts:  No  Thought Process:  Goal Directed and Linear  Orientation:  Full (Time,  Place, and Person)    Memory:   Grossly intact  Judgment:  Good  Insight:  Fair  Concentration:  Concentration: Good  Recall:  NA  Fund of Knowledge: Good  Language: Good  Psychomotor Activity:  Normal  Akathisia:  No  AIMS (if indicated): not done  Assets:  Communication Skills Desire for Improvement Housing Leisure Time Physical Health Resilience Social Support Talents/Skills Transportation Vocational/Educational  ADL's:  Intact  Cognition: WNL  Sleep:  Fair   PE: General: well-appearing; no acute distress  Pulm: no increased work of breathing on room air  Strength & Muscle Tone: within normal limits Neuro: no focal neurological deficits observed  Gait & Station: normal  Metabolic Disorder Labs: Lab Results  Component Value Date   HGBA1C 5.7 (H) 11/16/2021   No results found for: "PROLACTIN" Lab Results  Component Value Date   CHOL 184 11/16/2021   TRIG 85 11/16/2021   HDL 44 11/16/2021   CHOLHDL 4.2 11/16/2021   LDLCALC 124 (H) 11/16/2021   Lab Results  Component Value Date  TSH 1.570 11/16/2021   Wt Readings from Last 3 Encounters:  04/21/22 189 lb (85.7 kg)  01/25/22 193 lb (87.5 kg)  11/16/21 188 lb (85.3 kg)   Therapeutic Level Labs: No results found for: "LITHIUM" No results found for: "VALPROATE" No results found for: "CBMZ"  Screenings: GAD-7    Flowsheet Row Office Visit from 09/03/2021 in [facility] Office Visit from 07/02/2021 in [facility] Office Visit from 04/30/2021 in [facility] Office Visit from 03/03/2021 in [facility] Clinical Support from 12/30/2020 in [facility]  Total GAD-7 Score 9 5 6 6 10       PHQ2-9    Flowsheet Row Office Visit from 09/03/2021 in [facility] Office Visit from 07/02/2021 in [facility] Office Visit from  04/30/2021 in [facility] Office Visit from 03/03/2021 in [facility] Clinical Support from 12/30/2020 in [facility]  PHQ-2 Total Score 5 2 2 2 2   PHQ-9 Total Score 13 4 8 6 10       Flowsheet Row ED from 03/06/2022 in [facility] Visit from 09/03/2021 in [facility] Office Visit from 07/02/2021 in [facility]  C-SSRS RISK CATEGORY No Risk No Risk No Risk       Collaboration of Care: Collaboration of Care: Medication Management AEB ongoing medication management and Psychiatrist AEB established with this provider  Patient/Guardian was advised Release of Information must be obtained prior to any record release in order to collaborate their care with an outside provider. Patient/Guardian was advised if they have not already done so to contact the registration department to sign all necessary forms in order for [place] to release information regarding their care.   Consent: Patient/Guardian gives verbal consent for treatment and assignment of benefits for services provided during this visit. Patient/Guardian expressed understanding and agreed to proceed.   A total of *** minutes was spent involved in face to face clinical care, chart review, documentation, coordination with pharmacy, and medication management.   [facility] A  10/17/22

## 2022-10-18 ENCOUNTER — Ambulatory Visit (INDEPENDENT_AMBULATORY_CARE_PROVIDER_SITE_OTHER): Payer: No Payment, Other | Admitting: Psychiatry

## 2022-10-18 ENCOUNTER — Encounter (HOSPITAL_COMMUNITY): Payer: Self-pay | Admitting: Psychiatry

## 2022-10-18 ENCOUNTER — Encounter (HOSPITAL_COMMUNITY): Payer: No Payment, Other | Admitting: Psychiatry

## 2022-10-18 ENCOUNTER — Other Ambulatory Visit: Payer: Self-pay

## 2022-10-18 VITALS — BP 155/96 | HR 73 | Resp 16 | Ht 63.0 in | Wt 172.4 lb

## 2022-10-18 DIAGNOSIS — F209 Schizophrenia, unspecified: Secondary | ICD-10-CM

## 2022-10-18 DIAGNOSIS — G43009 Migraine without aura, not intractable, without status migrainosus: Secondary | ICD-10-CM

## 2022-10-18 DIAGNOSIS — Z79899 Other long term (current) drug therapy: Secondary | ICD-10-CM

## 2022-10-18 DIAGNOSIS — Z0000 Encounter for general adult medical examination without abnormal findings: Secondary | ICD-10-CM | POA: Diagnosis not present

## 2022-10-18 MED ORDER — METFORMIN HCL ER 500 MG PO TB24
500.0000 mg | ORAL_TABLET | Freq: Two times a day (BID) | ORAL | 2 refills | Status: DC
  Filled 2022-10-18 – 2022-11-15 (×2): qty 60, 30d supply, fill #0

## 2022-10-18 NOTE — Progress Notes (Signed)
[facility] MD Outpatient Progress Note  10/18/22 Stephanie Golden  MRN:  [id number]  Assessment:  Stephanie Golden presents for follow-up evaluation in-person. Today, 10/18/22, patient reports continued psychiatric stability and denies signs/sx of psychosis or mood instability. Has run out of metformin however did find it helpful for weight loss (noted to have 8 lb weight loss since last appt) and would like to restart. Patient was provided with samples of Lybalvi to bridge until next appointment; she is hoping to attain insurance through her workplace early next year. No other changes to plan of care at this time; will continue to monitor weight and metabolics carefully.   RTC in 2 months in person.  Identifying Information: Stephanie Golden is a 37 y.o. female with a history of schizophrenia who is an established patient with [facility] for management of symptoms of psychosis.   Plan:  # Schizophrenia Past medication trials: Zyprexa (weight gain) Status of problem: stable Interventions: -- Continue Lybalvi 2.5-5 mg nightly -- Patient provided with 2 months worth of samples today.  -- Extensively discussed option and recommendation to switch to more weight neutral antipsychotic such as Abilify however patient has declined   # Weight gain on antipsychotic: Zyprexa started Feb 2022; weight gain from 153 lbs July 2022 -> 193 lbs Jan 2024 -> 189 lbs 04/21/22 -> metformin started April 2024 -> 180 lbs 06/16/22 -> 172 lbs 10/18/22 Past medication trials: none prior Status of problem: improving Interventions: -- Patient on Lybalvi as above -- RESTART metformin 500 mg twice daily with meals -- Patient to continue lifestyle interventions including balanced diet and incorporating daily exercise  # Medication monitoring Interventions: -- Hgb A1c 5.7 (prediabetes); lipid profile 11/16/21 revealing for elevated LDL   -- Plan to order repeat labs at next visit  # Vitamin D  deficiency Status of problem: acute Interventions: -- Vitamin D 12.8 11/16/21 -- Continue Vitamin D3 1000 IU daily  # General health maintenance # Headaches concerning for migraines  Desire for OCP Status of problem: acute Interventions: -- Referral placed to internal medicine -- Connected patient with case manager to assist with application to Family Planning   Patient was given contact information for behavioral health clinic and was instructed to call 911 for emergencies.   Subjective:  Chief Complaint:  Chief Complaint  Patient presents with   Medication Management    Interval History:   Patient reports she has been doing "pretty good." Denies persistent periods of depression or anxiety. Denies overt AVH although identifies a episode last week in which she noticed her mind "drifting - a floating experience." States sometimes she experiences "negative thoughts" that maybe others want to harm her but feels she is able to challenge this very easily and these thoughts do not lead to any dysfunction. Denies SI/HI.   Ran out of metformin in August. Still taking half tablet Lybalvi daily. Has been using samples of Lybalvi. Believes she has about 2 weeks remaining. Feels metformin was helpful for losing weight and would like to restart. During period of weight gain, menses stopped for 3 months however have since returned.   Reports sleeping well and making up for lost sleep on the weekends. Sleeps about 4 hours daily during the week due to working night shifts. Hoping to transition to job at some point that would allow more regular schedule.   Does not have a PCP due to lack of insurance. Reports new-onset migraines in the last year; associated with nausea/vomiting. Endorses  photophobia. Occurring about twice weekly; each episode lasts about 30-40 minutes. Using Excedrin but not always helpful. Drinks 1 cup coffee a day in the morning; denies energy drinks.   Would appreciate assistance  in getting set up with Medicaid if possible.  She discusses her psychotic episode - she reflects that she had been under a lot of stress and getting poor sleep at the time; dad had recently passed away. Felt sx resolved in April 2023 when she moved out of her mom's house.  Amenable to continuing Lybalvi as prescribed and continuing to monitor weight and metabolics carefully.    Visit Diagnosis:    ICD-10-CM   1. Schizophrenia, unspecified type (HCC)  F20.9     2. High risk medication use  Z79.899     3. Nonintractable migraine, unspecified migraine type  G43.009 Ambulatory referral to Internal Medicine    4. Healthcare maintenance  Z00.00 Ambulatory referral to Internal Medicine     Past Psychiatric History:  Diagnoses: unspecified psychotic disorder (first diagnosed in Feb 2022) Medication trials: olanzapine, trazodone Hospitalizations: presented to ED 09/13/20 under IVC with AH Substance use:   -- Etoh: socially  -- Tobacco: Black and Milds cigars x2 daily  -- Denies illicit drug use  Past Medical History:  Past Medical History:  Diagnosis Date   H/O candidiasis 02/22/2011   yeast   H/O rubella 02/22/2011   yeast   H/O varicella    Involuntary commitment    No pertinent past medical history    Schizophrenia (HCC)     Past Surgical History:  Procedure Laterality Date   CESAREAN SECTION  08/07/2011   Procedure: CESAREAN SECTION;  Surgeon: Kirkland Hun, MD;  Location: [facility];  Service: Gynecology;  Laterality: N/A;  Primary cesarean section with delivery of baby girl at 42. Apgars 9/9.   stitches to r eye      Family Psychiatric History:  Uncle (maternal): Schizophrenia, diagnosed in his early 32s as well. He was able to live alone for some time, unfortunately was not very compliant and is currently living in a group home facility.   Family History:  Family History  Problem Relation Age of Onset   Drug abuse Father    Diabetes Maternal Uncle    Hypertension  Maternal Uncle    Cancer Paternal Aunt    Cancer Maternal Grandmother    Heart disease Paternal Grandmother    Hypertension Paternal Grandmother    Diabetes Paternal Grandmother    Hypertension Maternal Grandfather    Anesthesia problems Neg Hx    Hypotension Neg Hx    Malignant hyperthermia Neg Hx    Pseudochol deficiency Neg Hx     Social History:  Social History   Socioeconomic History   Marital status: Single    Spouse name: Not on file   Number of children: Not on file   Years of education: Not on file   Highest education level: Not on file  Occupational History   Not on file  Tobacco Use   Smoking status: Every Day    Types: Cigars, Cigarettes   Smokeless tobacco: Never   Tobacco comments:    Smokes Black and Mild Cigars x2 daily  Substance and Sexual Activity   Alcohol use: No   Drug use: No   Sexual activity: Yes    Birth control/protection: None  Other Topics Concern   Not on file  Social History Narrative   Not on file   Social Determinants of Health   Financial  Resource Strain: Not on file  Food Insecurity: Not on file  Transportation Needs: Not on file  Physical Activity: Not on file  Stress: Not on file  Social Connections: Not on file    Allergies: No Known Allergies  Current Medications: Current Outpatient Medications  Medication Sig Dispense Refill   OLANZapine-Samidorphan (LYBALVI) 5-10 MG TABS Take 2.5-5 mg by mouth daily in the afternoon.     metFORMIN (GLUCOPHAGE-XR) 500 MG 24 hr tablet Take 1 tablet (500 mg total) by mouth 2 (two) times daily with a meal. 60 tablet 2   No current facility-administered medications for this visit.    ROS: Denies any physical complaints  Objective:  Psychiatric Specialty Exam: Blood pressure (!) 155/96, pulse 73, resp. rate 16, height 5\' 3"  (1.6 m), weight 172 lb 6.4 oz (78.2 kg), SpO2 100%.Body mass index is 30.54 kg/m.  General Appearance: Casual and Well Groomed  Eye Contact:  Good  Speech:   Clear and Coherent and Normal Rate  Volume:  Normal  Mood:   "pretty good"  Affect:  Appropriate and Euthymic  Thought Content:  Denies AVH; IOR. Mild paranoia but fleeting and denies related distress/dysfunction.    Suicidal Thoughts:  No  Homicidal Thoughts:  No  Thought Process:  Goal Directed and Linear  Orientation:  Full (Time, Place, and Person)    Memory:   Grossly intact  Judgment:  Good  Insight:  Fair  Concentration:  Concentration: Good  Recall:  NA  Fund of Knowledge: Good  Language: Good  Psychomotor Activity:  Normal  Akathisia:  No  AIMS (if indicated): not done  Assets:  Communication Skills Desire for Improvement Housing Leisure Time Physical Health Resilience Social Support Talents/Skills Transportation Vocational/Educational  ADL's:  Intact  Cognition: WNL  Sleep:  Fair   PE: General: well-appearing; no acute distress  Pulm: no increased work of breathing on room air  Strength & Muscle Tone: within normal limits Neuro: no focal neurological deficits observed  Gait & Station: normal  Metabolic Disorder Labs: Lab Results  Component Value Date   HGBA1C 5.7 (H) 11/16/2021   No results found for: "PROLACTIN" Lab Results  Component Value Date   CHOL 184 11/16/2021   TRIG 85 11/16/2021   HDL 44 11/16/2021   CHOLHDL 4.2 11/16/2021   LDLCALC 124 (H) 11/16/2021   Lab Results  Component Value Date   TSH 1.570 11/16/2021   Wt Readings from Last 3 Encounters:  10/18/22 172 lb 6.4 oz (78.2 kg)  04/21/22 189 lb (85.7 kg)  01/25/22 193 lb (87.5 kg)   Therapeutic Level Labs: No results found for: "LITHIUM" No results found for: "VALPROATE" No results found for: "CBMZ"  Screenings: GAD-7    Flowsheet Row Office Visit from 09/03/2021 in [facility] Office Visit from 07/02/2021 in [facility] Office Visit from 04/30/2021 in [facility] Office Visit from 03/03/2021  in [facility] Clinical Support from 12/30/2020 in [facility]  Total GAD-7 Score 9 5 6 6 10       PHQ2-9    Flowsheet Row Office Visit from 09/03/2021 in [facility] Office Visit from 07/02/2021 in [facility] Office Visit from 04/30/2021 in [facility] Office Visit from 03/03/2021 in [facility] Clinical Support from 12/30/2020 in [place]  PHQ-2 Total Score 5 2 2 2 2   PHQ-9  Total Score 13 4 8 6 10       Flowsheet Row ED from 03/06/2022 in [facility] Visit from 09/03/2021 in [facility] Office Visit from 07/02/2021 in [facility]  C-SSRS RISK CATEGORY No Risk No Risk No Risk       Collaboration of Care: Collaboration of Care: Medication Management AEB ongoing medication management and Psychiatrist AEB established with this provider  Patient/Guardian was advised Release of Information must be obtained prior to any record release in order to collaborate their care with an outside provider. Patient/Guardian was advised if they have not already done so to contact the registration department to sign all necessary forms in order for [place] to release information regarding their care.   Consent: Patient/Guardian gives verbal consent for treatment and assignment of benefits for services provided during this visit. Patient/Guardian expressed understanding and agreed to proceed.   A total of 40 minutes was spent involved in face to face clinical care, chart review, documentation, coordination with case management, and medication management.   [facility]  10/18/22

## 2022-10-18 NOTE — Patient Instructions (Signed)
Thank you for attending your appointment today.  -- RESTART metformin 500 mg twice daily with food -- Continue other medications as prescribed.  Please do not make any changes to medications without first discussing with your provider. If you are experiencing a psychiatric emergency, please call 911 or present to your nearest emergency department. Additional crisis, medication management, and therapy resources are included below.  [facility]  [address] [place], [place] [address] [phone] WALK-IN URGENT CARE 24/7 FOR ANYONE [address], [place], [place]  [phone] Fax: [phone] [email or web address] *Interpreters available *Accepts all insurance and uninsured for Urgent Care needs *Accepts Medicaid and uninsured for outpatient treatment (below)      ONLY FOR [facility]  Below:    Outpatient New Patient Assessment/Therapy Walk-ins:        Monday -Thursday 8am until slots are full.        Every Friday 1pm-4pm  (first come, first served)                   New Patient Psychiatry/Medication Management        Monday-Friday 8am-11am (first come, first served)               For all walk-ins we ask that you arrive by 7:15am, because patients will be seen in the order of arrival.

## 2022-10-28 ENCOUNTER — Other Ambulatory Visit: Payer: Self-pay

## 2022-11-15 ENCOUNTER — Other Ambulatory Visit: Payer: Self-pay

## 2022-12-19 NOTE — Progress Notes (Unsigned)
[facility] MD Outpatient Progress Note  12/20/22 Stephanie Golden  MRN:  [id number]  Assessment:  Stephanie Golden presents for follow-up evaluation in-person. Today, 12/20/22, patient reports continued psychiatric stability without signs/symptoms of psychosis or mood instability.  Although metformin appeared to be initially helpful for curbing appetite, she demonstrates resurgence of increased appetite with associated weight gain this interval.  She is tolerating metformin well and is amenable to further titration as below.  Patient remains hesitant to switch from Zyprexa to more weight neutral medication although this will continue to be discussed. Patient was provided with samples of Lybalvi to bridge until next appointment; she is hoping to attain insurance through her workplace March 2025. No other changes to plan of care at this time; will continue to monitor weight and metabolics carefully.   RTC in 2.5 months in person.  Identifying Information: Stephanie Golden is a 37 y.o. female with a history of schizophrenia who is an established patient with [facility] for management of symptoms of psychosis.   Plan:  # Schizophrenia Past medication trials: Zyprexa (weight gain) Status of problem: stable Interventions: -- Continue Lybalvi 2.5-5 mg nightly -- Patient provided with 2.5 months worth of samples today; patient anticipates she will be getting private insurance through employer March 2025 and should be able to pursue coverage at that time.  -- Extensively discussed option and recommendation to switch to more weight neutral antipsychotic such as Abilify however patient has declined   # Weight gain on antipsychotic: Zyprexa started Feb 2022; weight gain from 153 lbs July 2022 -> 193 lbs Jan 2024 -> 189 lbs 04/21/22 -> metformin started April 2024 -> 180 lbs 06/16/22 -> 172 lbs 10/18/22 -> 177/4 lbs 12/20/22 Past medication trials: none prior Status of problem: not  improving Interventions: -- Patient on Lybalvi as above -- INCREASE metformin to 1500 mg qAM with breakfast -- Patient to continue lifestyle interventions including balanced diet and incorporating daily exercise  # Medication monitoring Interventions: -- Hgb A1c 5.7 (prediabetes); lipid profile 11/16/21 revealing for elevated LDL   -- Plan to order repeat labs at next visit  # Vitamin D deficiency Status of problem: acute Interventions: -- Vitamin D 12.8 11/16/21  -- Will order repeat level once patient reports consistent adherence -- RESTART Vitamin D3 1000 IU daily  # General health maintenance # Headaches concerning for migraines  Desire for OCP Status of problem: acute Interventions: -- Referral previously placed to internal medicine; pending review  Patient was given contact information for behavioral health clinic and was instructed to call 911 for emergencies.   Subjective:  Chief Complaint:  Chief Complaint  Patient presents with   Medication Management    Interval History:   Reports she continues to take Lybalvi as prescribed and finds it helpful. Mood has been "good" - some irritability when she gets less sleep due to work. Reports mood has actually been "better" and reports decreased feelings of guilt and less self-critical thoughts. Denies SI/HI.  Has been taking metformin and tolerating well.  Although she felt metformin was initially helpful for curbing appetite, she feels appetite has been increased lately.  Amenable to further titration of metformin pending weight today.  She has not been taking vitamin D; provided reeducation on importance of supplementation and she expresses intent to start supplement at this time.  Would appreciate samples of Lybalvi through next appointment; she plans to get insurance through her employer in March 2025.  Visit Diagnosis:    ICD-10-CM  1. Schizophrenia, unspecified type (HCC)  F20.9     2. High risk medication use   Z79.899     3. Drug-induced weight gain  R63.5    T50.905A       Past Psychiatric History:  Diagnoses: unspecified psychotic disorder (first diagnosed in Feb 2022) Medication trials: olanzapine, trazodone Hospitalizations: presented to ED 09/13/20 under IVC with AH Substance use:   -- Etoh: socially  -- Tobacco: Black and Milds cigars x2 daily  -- Denies illicit drug use  Past Medical History:  Past Medical History:  Diagnosis Date   H/O candidiasis 02/22/2011   yeast   H/O rubella 02/22/2011   yeast   H/O varicella    Involuntary commitment    No pertinent past medical history    Schizophrenia (HCC)     Past Surgical History:  Procedure Laterality Date   CESAREAN SECTION  08/07/2011   Procedure: CESAREAN SECTION;  Surgeon: Kirkland Hun, MD;  Location: [facility];  Service: Gynecology;  Laterality: N/A;  Primary cesarean section with delivery of baby girl at 30. Apgars 9/9.   stitches to r eye      Family Psychiatric History:  Uncle (maternal): Schizophrenia, diagnosed in his early 98s as well. He was able to live alone for some time, unfortunately was not very compliant and is currently living in a group home facility.   Family History:  Family History  Problem Relation Age of Onset   Drug abuse Father    Diabetes Maternal Uncle    Hypertension Maternal Uncle    Cancer Paternal Aunt    Cancer Maternal Grandmother    Heart disease Paternal Grandmother    Hypertension Paternal Grandmother    Diabetes Paternal Grandmother    Hypertension Maternal Grandfather    Anesthesia problems Neg Hx    Hypotension Neg Hx    Malignant hyperthermia Neg Hx    Pseudochol deficiency Neg Hx     Social History:  Social History   Socioeconomic History   Marital status: Single    Spouse name: Not on file   Number of children: Not on file   Years of education: Not on file   Highest education level: Not on file  Occupational History   Not on file  Tobacco Use   Smoking  status: Every Day    Types: Cigars, Cigarettes   Smokeless tobacco: Never   Tobacco comments:    Smokes Black and Mild Cigars x2 daily  Substance and Sexual Activity   Alcohol use: No   Drug use: No   Sexual activity: Yes    Birth control/protection: None  Other Topics Concern   Not on file  Social History Narrative   Not on file   Social Determinants of Health   Financial Resource Strain: Not on file  Food Insecurity: Not on file  Transportation Needs: Not on file  Physical Activity: Not on file  Stress: Not on file  Social Connections: Not on file    Allergies: No Known Allergies  Current Medications: Current Outpatient Medications  Medication Sig Dispense Refill   OLANZapine-Samidorphan (LYBALVI) 5-10 MG TABS Take 2.5-5 mg by mouth daily in the afternoon.     metFORMIN (GLUCOPHAGE-XR) 500 MG 24 hr tablet Take 3 tablets (1,500 mg total) by mouth daily with breakfast. 90 tablet 2   No current facility-administered medications for this visit.    ROS: Reports increased appetite  Objective:  Psychiatric Specialty Exam: Weight 177 lb 6.4 oz (80.5 kg).Body mass index is 31.42  kg/m.  General Appearance: Casual and Well Groomed  Eye Contact:  Good  Speech:  Clear and Coherent and Normal Rate  Volume:  Normal  Mood:   "good"  Affect:  Appropriate and Euthymic  Thought Content:  Denies AVH; IOR.  Denies paranoia.    Suicidal Thoughts:  No  Homicidal Thoughts:  No  Thought Process:  Goal Directed and Linear  Orientation:  Full (Time, Place, and Person)    Memory:   Grossly intact  Judgment:  Good  Insight:  Fair  Concentration:  Concentration: Good  Recall:  NA  Fund of Knowledge: Good  Language: Good  Psychomotor Activity:  Normal  Akathisia:  No  AIMS (if indicated): not done  Assets:  Communication Skills Desire for Improvement Housing Leisure Time Physical Health Resilience Social Support Talents/Skills Transportation Vocational/Educational   ADL's:  Intact  Cognition: WNL  Sleep:  Fair   PE: General: well-appearing; no acute distress  Pulm: no increased work of breathing on room air  Strength & Muscle Tone: within normal limits Neuro: no focal neurological deficits observed  Gait & Station: normal  Metabolic Disorder Labs: Lab Results  Component Value Date   HGBA1C 5.7 (H) 11/16/2021   No results found for: "PROLACTIN" Lab Results  Component Value Date   CHOL 184 11/16/2021   TRIG 85 11/16/2021   HDL 44 11/16/2021   CHOLHDL 4.2 11/16/2021   LDLCALC 124 (H) 11/16/2021   Lab Results  Component Value Date   TSH 1.570 11/16/2021   Wt Readings from Last 3 Encounters:  12/20/22 177 lb 6.4 oz (80.5 kg)  10/18/22 172 lb 6.4 oz (78.2 kg)  04/21/22 189 lb (85.7 kg)   Therapeutic Level Labs: No results found for: "LITHIUM" No results found for: "VALPROATE" No results found for: "CBMZ"  Screenings: GAD-7    Flowsheet Row Office Visit from 09/03/2021 in [facility] Office Visit from 07/02/2021 in [facility] Office Visit from 04/30/2021 in [facility] Office Visit from 03/03/2021 in [facility] Clinical Support from 12/30/2020 in [facility]  Total GAD-7 Score 9 5 6 6 10       PHQ2-9    Flowsheet Row Office Visit from 09/03/2021 in [facility] Office Visit from 07/02/2021 in [facility] Office Visit from 04/30/2021 in [facility] Office Visit from 03/03/2021 in [facility] Clinical Support from 12/30/2020 in [facility]  PHQ-2 Total Score 5 2 2 2 2   PHQ-9 Total Score 13 4 8 6 10       Flowsheet Row ED from 03/06/2022 in [facility] Visit from 09/03/2021 in [facility] Office  Visit from 07/02/2021 in [facility]  C-SSRS RISK CATEGORY No Risk No Risk No Risk       Collaboration of Care: Collaboration of Care: Medication Management AEB ongoing medication management and Psychiatrist AEB established with this provider  Patient/Guardian was advised Release of Information must be obtained prior to any record release in order to collaborate their care with an outside provider. Patient/Guardian was advised if they have not already done so to contact the registration department to sign all necessary forms in order for [place] to release information regarding their care.   Consent: Patient/Guardian gives verbal consent for treatment and assignment of benefits for services provided  during this visit. Patient/Guardian expressed understanding and agreed to proceed.   A total of 35 minutes was spent involved in face to face clinical care, chart review, documentation, coordination with case management, and medication management.   [facility] Stephanie Golden 12/20/22

## 2022-12-20 ENCOUNTER — Ambulatory Visit (INDEPENDENT_AMBULATORY_CARE_PROVIDER_SITE_OTHER): Payer: No Payment, Other | Admitting: Psychiatry

## 2022-12-20 ENCOUNTER — Encounter (HOSPITAL_COMMUNITY): Payer: Self-pay | Admitting: Psychiatry

## 2022-12-20 ENCOUNTER — Other Ambulatory Visit: Payer: Self-pay

## 2022-12-20 VITALS — Wt 177.4 lb

## 2022-12-20 DIAGNOSIS — R635 Abnormal weight gain: Secondary | ICD-10-CM | POA: Diagnosis not present

## 2022-12-20 DIAGNOSIS — F209 Schizophrenia, unspecified: Secondary | ICD-10-CM

## 2022-12-20 DIAGNOSIS — Z79899 Other long term (current) drug therapy: Secondary | ICD-10-CM

## 2022-12-20 DIAGNOSIS — T50905A Adverse effect of unspecified drugs, medicaments and biological substances, initial encounter: Secondary | ICD-10-CM | POA: Diagnosis not present

## 2022-12-20 MED ORDER — METFORMIN HCL ER 500 MG PO TB24
1500.0000 mg | ORAL_TABLET | Freq: Every day | ORAL | 2 refills | Status: DC
  Filled 2022-12-20: qty 90, 30d supply, fill #0

## 2022-12-20 NOTE — Patient Instructions (Signed)
Thank you for attending your appointment today.  -- INCREASE metformin to 1500 mg with breakfast -- RESTART Vitamin D3 1000 IU daily -- Continue other medications as prescribed.  Please do not make any changes to medications without first discussing with your provider. If you are experiencing a psychiatric emergency, please call 911 or present to your nearest emergency department. Additional crisis, medication management, and therapy resources are included below.  [facility]  [address], [place], [place] [address] [phone] WALK-IN URGENT CARE 24/7 FOR ANYONE [address], [place], [place]  [phone] Fax: [phone] [email or web address] *Interpreters available *Accepts all insurance and uninsured for Urgent Care needs *Accepts Medicaid and uninsured for outpatient treatment (below)      ONLY FOR [facility]  Below:    Outpatient New Patient Assessment/Therapy Walk-ins:        Monday, Wednesday, and Thursday 8am until slots are full (first come, first served)                   New Patient Psychiatry/Medication Management        Monday-Friday 8am-11am (first come, first served)               For all walk-ins we ask that you arrive by 7:15am, because patients will be seen in the order of arrival.

## 2022-12-30 ENCOUNTER — Other Ambulatory Visit: Payer: Self-pay

## 2023-03-06 NOTE — Progress Notes (Unsigned)
 [facility] MD Outpatient Progress Note  03/07/23 Stephanie Golden  MRN:  [id number]  Assessment:  Stephanie Golden presents for follow-up evaluation in-person. Today, 03/07/23, patient continued psychiatric stability without signs/symptoms of mood disturbance or psychosis.  She has not been taking metformin and would like to focus on lifestyle interventions to mitigate weight gain.  She is amenable to obtaining updated labs as below and reassessing indication for metformin at next appointment.  Additionally, patient was noted to have elevated blood pressure on visit today; the referral placed to internal medicine today for patient to establish with primary care.  RTC in 3 months in person.  Identifying Information: Stephanie Golden is a 38 y.o. female with a history of schizophrenia who is an established patient with [facility] for management of symptoms of psychosis.   Plan:  # Schizophrenia Past medication trials: Zyprexa (weight gain) Status of problem: stable Interventions: -- Continue Lybalvi 2.5-5 mg nightly -- Reports adequate supply of samples for time being -- Extensively discussed option and recommendation to switch to more weight neutral antipsychotic such as Abilify however patient has declined   # Weight gain on antipsychotic: Zyprexa started Feb 2022; weight gain from 153 lbs July 2022 -> 193 lbs Jan 2024 -> 189 lbs 04/21/22 -> metformin started April 2024 -> 180 lbs 06/16/22 -> 172 lbs 10/18/22 -> 177 lbs 12/20/22 ->  175 lbs 03/07/23 Past medication trials: none prior Status of problem: stable Interventions: -- Patient on Lybalvi as above -- DEFER metformin for time being given noncompliance; repeat labs as below -- Patient to continue lifestyle interventions including balanced diet and incorporating daily exercise  # Medication monitoring Interventions: -- Hgb A1c 5.7 (prediabetes); lipid profile 11/16/21 revealing for elevated LDL  -- Repeat CBC, CMP,  metabolic labs ordered  # Vitamin D deficiency Status of problem: acute Interventions: -- Vitamin D 12.8 11/16/21  -- Repeat level ordered -- Patient currently not compliant with Vitamin D3 1000 IU daily  # General health maintenance # Persistently elevated BP Status of problem: acute Interventions: -- Referral previously placed to internal medicine; pending review - placed new referral today given persistently elevated blood pressure readings on clinic visits  Patient was given contact information for behavioral health clinic and was instructed to call 911 for emergencies.   Subjective:  Chief Complaint:  Chief Complaint  Patient presents with   Medication Management   Injections    Interval History:   Reports she has been "good" overall and identifies that each day she learns more about herself and feels more self aware. Endorses some normal levels of anxiety when in overstimulating environments. 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 She is no longer on 3rd shift and now working more normal hours. Sleep has been better with about 5 hours on work days and more on non-work days.   Denies paranoia, AVH. Denies SI/HI. Taking Lybalvi about 4-5 days of the week. Feels appetite has been a bit low and weight gain has plateaued. Not exercising outside of physical labor at work.   Has not been taking metformin - last took in Nov. Has not been taking vitamin D. Not sure if she will be getting insurance through work. Amenable to continuing Lybalvi as rx (verifies she has enough samples remaining) and obtaining updated labs to guide need for metformin or vit D.  Visit Diagnosis:    ICD-10-CM   1. Schizophrenia, unspecified type (  HCC)  F20.9     2. High risk medication use  Z79.899 HgB A1c    Lipid panel    CBC w/Diff/Platelet    Comprehensive Metabolic Panel (CMET)    3. Vitamin D deficiency  E55.9 Vitamin D (25 hydroxy)     4. Elevated blood pressure reading  R03.0 Ambulatory referral to Internal Medicine       Past Psychiatric History:  Diagnoses: unspecified psychotic disorder (first diagnosed in Feb 2022) Medication trials: olanzapine, trazodone Hospitalizations: presented to ED 09/13/20 under IVC with [facility] Substance use:   -- Etoh: socially  -- Tobacco: Black and Milds cigars x2 daily  -- Denies illicit drug use  Past Medical History:  Past Medical History:  Diagnosis Date   H/O candidiasis 02/22/2011   yeast   H/O rubella 02/22/2011   yeast   H/O varicella    Involuntary commitment    No pertinent past medical history    Schizophrenia (HCC)     Past Surgical History:  Procedure Laterality Date   CESAREAN SECTION  08/07/2011   Procedure: CESAREAN SECTION;  Surgeon: Kirkland Hun, MD;  Location: [facility];  Service: Gynecology;  Laterality: N/A;  Primary cesarean section with delivery of baby girl at 51. Apgars 9/9.   stitches to r eye      Family Psychiatric History:  Uncle (maternal): Schizophrenia, diagnosed in his early 41s as well. He was able to live alone for some time, unfortunately was not very compliant and is currently living in a group home facility.   Family History:  Family History  Problem Relation Age of Onset   Drug abuse Father    Diabetes Maternal Uncle    Hypertension Maternal Uncle    Cancer Paternal Aunt    Cancer Maternal Grandmother    Heart disease Paternal Grandmother    Hypertension Paternal Grandmother    Diabetes Paternal Grandmother    Hypertension Maternal Grandfather    Anesthesia problems Neg Hx    Hypotension Neg Hx    Malignant hyperthermia Neg Hx    Pseudochol deficiency Neg Hx     Social History:  Social History   Socioeconomic History   Marital status: Single    Spouse name: Not on file   Number of children: Not on file   Years of education: Not on file   Highest education level: Not on file  Occupational History   Not on file   Tobacco Use   Smoking status: Every Day    Types: Cigars, Cigarettes   Smokeless tobacco: Never   Tobacco comments:    Smokes Black and Mild Cigars x2 daily  Substance and Sexual Activity   Alcohol use: No   Drug use: No   Sexual activity: Yes    Birth control/protection: None  Other Topics Concern   Not on file  Social History Narrative   Not on file   Social Drivers of Health   Financial Resource Strain: Not on file  Food Insecurity: Not on file  Transportation Needs: Not on file  Physical Activity: Not on file  Stress: Not on file  Social Connections: Not on file    Allergies: No Known Allergies  Current Medications: Current Outpatient Medications  Medication Sig Dispense Refill   OLANZapine-Samidorphan (LYBALVI) 5-10 MG TABS Take 2.5-5 mg by mouth daily in the afternoon.     No current facility-administered medications for this visit.    ROS: Reports increased appetite  Objective:  Psychiatric Specialty Exam: Blood pressure (!) 157/107, height  5\' 3"  (1.6 m), weight 175 lb (79.4 kg), SpO2 100%.Body mass index is 31 kg/m.  General Appearance: Casual and Well Groomed  Eye Contact:  Good  Speech:  Clear and Coherent and Normal Rate  Volume:  Normal  Mood:   "really good"  Affect:  Appropriate and Euthymic  Thought Content:  Denies AVH; IOR.  Denies paranoia.    Suicidal Thoughts:  No  Homicidal Thoughts:  No  Thought Process:  Goal Directed and Linear  Orientation:  Full (Time, Place, and Person)    Memory:   Grossly intact  Judgment:  Good  Insight:  Good  Concentration:  Concentration: Good  Recall:  NA  Fund of Knowledge: Good  Language: Good  Psychomotor Activity:  Normal  Akathisia:  No  AIMS (if indicated): not done  Assets:  Communication Skills Desire for Improvement Housing Leisure Time Physical Health Resilience Social Support Talents/Skills Transportation Vocational/Educational  ADL's:  Intact  Cognition: WNL  Sleep:    improving   PE: General: well-appearing; no acute distress  Pulm: no increased work of breathing on room air  Strength & Muscle Tone: within normal limits Neuro: no focal neurological deficits observed  Gait & Station: normal  Metabolic Disorder Labs: Lab Results  Component Value Date   HGBA1C 5.7 (H) 11/16/2021   No results found for: "PROLACTIN" Lab Results  Component Value Date   CHOL 184 11/16/2021   TRIG 85 11/16/2021   HDL 44 11/16/2021   CHOLHDL 4.2 11/16/2021   LDLCALC 124 (H) 11/16/2021   Lab Results  Component Value Date   TSH 1.570 11/16/2021   Wt Readings from Last 3 Encounters:  03/07/23 175 lb (79.4 kg)  12/20/22 177 lb 6.4 oz (80.5 kg)  10/18/22 172 lb 6.4 oz (78.2 kg)   Therapeutic Level Labs: No results found for: "LITHIUM" No results found for: "VALPROATE" No results found for: "CBMZ"  Screenings: GAD-7    Flowsheet Row Office Visit from 09/03/2021 in [facility] Office Visit from 07/02/2021 in [facility] Office Visit from 04/30/2021 in [facility] Office Visit from 03/03/2021 in [facility] Clinical Support from 12/30/2020 in [facility]  Total GAD-7 Score 9 5 6 6 10       PHQ2-9    Flowsheet Row Office Visit from 09/03/2021 in [facility] Office Visit from 07/02/2021 in [facility] Office Visit from 04/30/2021 in [facility] Office Visit from 03/03/2021 in [facility] Clinical Support from 12/30/2020 in [facility]  PHQ-2 Total Score 5 2 2 2 2   PHQ-9 Total Score 13 4 8 6 10       Flowsheet Row ED from 03/06/2022 in [facility] Visit from 09/03/2021 in [facility] Office Visit from 07/02/2021 in [facility]  C-SSRS RISK CATEGORY No Risk No Risk No Risk       Collaboration of Care: Collaboration of Care: Medication Management AEB ongoing medication management and Psychiatrist AEB established with this provider  Patient/Guardian was advised Release of Information must be obtained prior to any record release in order to collaborate their care with an outside provider. Patient/Guardian was advised if they have not already done so to contact the registration department to sign all necessary forms in order for [place] to release information regarding their  care.   Consent: Patient/Guardian gives verbal consent for treatment and assignment of benefits for services provided during this visit. Patient/Guardian expressed understanding and agreed to proceed.   A total of 25 minutes was spent involved in face to face clinical care, chart review, documentation, coordination with case management, and medication management.   [facility] A Azana Kiesler 03/07/23

## 2023-03-07 ENCOUNTER — Ambulatory Visit (INDEPENDENT_AMBULATORY_CARE_PROVIDER_SITE_OTHER): Payer: No Payment, Other | Admitting: Psychiatry

## 2023-03-07 ENCOUNTER — Telehealth (HOSPITAL_COMMUNITY): Payer: Self-pay

## 2023-03-07 ENCOUNTER — Encounter (HOSPITAL_COMMUNITY): Payer: Self-pay | Admitting: Psychiatry

## 2023-03-07 VITALS — BP 157/107 | Ht 63.0 in | Wt 175.0 lb

## 2023-03-07 DIAGNOSIS — E559 Vitamin D deficiency, unspecified: Secondary | ICD-10-CM

## 2023-03-07 DIAGNOSIS — R030 Elevated blood-pressure reading, without diagnosis of hypertension: Secondary | ICD-10-CM | POA: Diagnosis not present

## 2023-03-07 DIAGNOSIS — F209 Schizophrenia, unspecified: Secondary | ICD-10-CM

## 2023-03-07 DIAGNOSIS — Z79899 Other long term (current) drug therapy: Secondary | ICD-10-CM | POA: Diagnosis not present

## 2023-03-07 NOTE — Patient Instructions (Signed)

## 2023-03-07 NOTE — Telephone Encounter (Signed)
 Tried to call pt and tell her per docs order that per provider and BP being abnormally high   "She can also likely expedite the process if she calls the clinic directly at [phone] to make an appointment. The clinic I referred her to [facility] on [address] and I know they see folks without insurance"   - words from Dr. Josephina Shih, if PT picks up or calls back.

## 2023-03-20 ENCOUNTER — Telehealth (HOSPITAL_COMMUNITY): Payer: Self-pay | Admitting: Psychiatry

## 2023-03-21 ENCOUNTER — Telehealth (HOSPITAL_COMMUNITY): Payer: Self-pay | Admitting: Psychiatry

## 2023-03-21 NOTE — Telephone Encounter (Signed)
 Received message from front desk that patient was requesting to be seen by this provider due to "manic" episode at work that has since resolved and she is now requesting clearance to return to work. She was encouraged to return following day for walk-in clinic.  Called patient for approx. 15 minute phone call to conduct brief check in before she presents for further evaluation tomorrow:  She shares that she had been in her usual state of health until Friday morning when she presented to work and then had a "manic episode." She reports on her way to work she was feeling a bit more religious with increased praying and "giving God the glory." When she arrived she was more distractible and had trouble attending to tasks.  She denies any unsafe, inappropriate, or aggressive behaviors.  However she reports disruption in reality testing in the form of concerns for thought broadcasting; denies thought insertion or mind reading.  Denies overt AVH, paranoia, or grandiosity. Reports mood around this time was "normal" and denies feeling elevated or grandiose. Denies feeling depressed. Reports she had been sleeping well with about 5-7 hours nightly.  She states that coworkers and [employer], [employer] are aware of her schizophrenia diagnosis and approached her about change in behavior.  Due to concern, they called EMS for further evaluation who administered Haldol/Ativan injection prior to taking her to [place].   Per review of [facility] records in CareEverywhere: Patient was at work when coworkers stated she was displaying erratic behavior. Employees stated she was talking to her self and seeing things that we not there.  Upon evaluation, patient was notably hyperreligious, disorganized with flight of ideas, with hyperverbal and pressured speech, noted to be pacing/yelling/slamming hands on nursing station at one point. BAL < 10; UDS appropriately positive for benzodiazepines.  Presentation felt concerning for possible hypomanic  episode. She was continued on Lybalvi 5-10 mg daily demonstrated rapid resolution of symptoms in the ED although with some mild hyperreligious thought processes she was able to suppress. IVC was rescinded and she was discharged home with agreement of family.    Currently, patient reports near resolution of these symptoms since discharge.  She states she has been taking Lybalvi 5-10 mg consistently since Saturday. Leading up to episode, was only taking Lybalvi 1/2 tablet on work days (typically 5 days per week). Denies any substance use including cannabis/CBD/THC products or other illicit substances. Reports infrequent etoh use with 1 shot of etoh a week prior to episode.  Patient is highly fixated in obtaining return to work letter due to financial concerns if she is out of work for a prolonged period.  Discussed importance of ensuring patient's mental health stability and encouraged her to attend walk-in clinic hours for more thorough evaluation tomorrow.  Reviewed that I cannot guarantee she will be provided such a letter but that if provider tomorrow feels that this episode has resolved and there are no acute safety concerns, it could be reasonable for her to return to work and I can assist in providing letter if appropriate.  In the meantime, encouraged continued adherence to Lybalvi 5-10 mg daily.  Unclear if recent episode represents resurgence of psychotic symptoms versus bipolar spectrum illness and ongoing evaluation will be helpful in clarifying diagnosis.  Daine Gip, MD 03/21/23

## 2023-03-22 ENCOUNTER — Encounter (HOSPITAL_COMMUNITY): Payer: Self-pay | Admitting: Student

## 2023-03-22 ENCOUNTER — Ambulatory Visit (INDEPENDENT_AMBULATORY_CARE_PROVIDER_SITE_OTHER): Admitting: Student

## 2023-03-22 VITALS — BP 140/62 | HR 67 | Ht 63.0 in | Wt 173.2 lb

## 2023-03-22 DIAGNOSIS — F209 Schizophrenia, unspecified: Secondary | ICD-10-CM | POA: Diagnosis not present

## 2023-03-22 DIAGNOSIS — F29 Unspecified psychosis not due to a substance or known physiological condition: Secondary | ICD-10-CM

## 2023-03-22 DIAGNOSIS — R635 Abnormal weight gain: Secondary | ICD-10-CM | POA: Diagnosis not present

## 2023-03-22 DIAGNOSIS — T50905A Adverse effect of unspecified drugs, medicaments and biological substances, initial encounter: Secondary | ICD-10-CM

## 2023-03-22 DIAGNOSIS — Z79899 Other long term (current) drug therapy: Secondary | ICD-10-CM

## 2023-03-22 DIAGNOSIS — E559 Vitamin D deficiency, unspecified: Secondary | ICD-10-CM | POA: Diagnosis not present

## 2023-03-22 NOTE — Progress Notes (Cosign Needed Addendum)
 [facility] MD Outpatient Progress Note  03/22/23 Stephanie Golden  MRN:  [id number]  Assessment:  Stephanie Golden presents for follow-up evaluation in-person after ED visit on 3/7-3/8. Today, 03/22/23, patient presented for walk-in hours requesting clearance after she was seen at [facility] ED after reports of manic, bizarre, psychotic, hyperreligious behaviors. Patient had quick resolution of symptoms, or was at least able to reflect and suppress sx while at [facility] on 3/8, leading to her discharge without inpatient admission.   On assessment today, patient is able to discuss events in a linear manner. She reports that she understood that she had a psychotic episode coming on and wanted time to herself at work, which was not provided, and in turn, worsened her sx. She notes hearing voices or feeling a "darkness" that she began to "rebuke." Because her supervisor had never seen an episode, patient reports he was a bit overbearing, inquiring about her need for medical attention, which she declined numerous times. She is able to acknowledge decisions she made, like giving first responders an incorrect name, were not good decisions and made them even more concerned about her mental state. While she does minimize her sx to be able to return to work, she is able to discuss them in an appropriate manner, while still offering a thorough recount and reflection of events. She reports that since 3/8, she has been compliant with medications, taking them nightly. She is also now getting a sufficient amount of sleep. Her appetite has not yet fully restored, but she is working to increase her intake.   Patient advocates for herself to have a letter written to clear her to return to work. This Clinical research associate advises that I do not believe I am appropriate to write the letter since this is the first time I've worked with her, but I will speak to her primary psychiatrist, Dr. Josephina Shih. I do believe that this patient, since maintaining compliance  with her medications, may be returned to baseline; she was previously stable on Lybalvi for years, per documentation, and has not required inpatient hospitalization. It seems that with her semi-compliance to the medication, she had resurgence of manic/psychotic sx, which has resolved with compliance. Patient continues to deny mood sx. Since returning home, she denies further psychotic sx. Today, she speaks about her religious beliefs and how she wanted to rid herself of the "darkness" that she felt around her on Friday. However, she is not hyperreligious today. Patient poses no safety concerns toward herself nor others at this time.   Again discussed with patient if she was having difficulty maintaining compliance due to sedative effects on non-working days, that we could consider switching agents, but she declined and stated that she is willing to maintain compliance with her medication.  Attempted to obtain collateral from boyfriend x 2, without answer. Will also attempt to reach out to mom.   Update: Obtained collateral from boyfriend who corroborated patient's improvements since returning home. See below for more detail.  Will have a close follow-up with patient on 3/20, to assess if she remains medication compliant and still with resolution of sx, with Dr. Josephina Shih as attending psychiatrist.   Identifying Information: Stephanie Golden is a 38 y.o. female with a history of schizophrenia who is an established patient with [facility] for management of symptoms of psychosis.   Plan:  # Schizophrenia Past medication trials: Zyprexa (weight gain) Status of problem: stable Interventions: -- Continue Lybalvi 2.5-5 mg nightly -- Reports adequate supply of  samples for time being -- Extensively discussed option and recommendation to switch to more weight neutral antipsychotic such as Abilify however patient has declined   # Weight gain on antipsychotic: Zyprexa started Feb  2022; weight gain from 153 lbs July 2022 -> 193 lbs Jan 2024 -> 189 lbs 04/21/22 -> metformin started April 2024 -> 180 lbs 06/16/22 -> 172 lbs 10/18/22 -> 177 lbs 12/20/22 ->  175 lbs 03/07/23 Past medication trials: none prior Status of problem: stable Interventions: -- Patient on Lybalvi as above -- DEFER metformin for time being given noncompliance; repeat labs as below -- Patient to continue lifestyle interventions including balanced diet and incorporating daily exercise  # Medication monitoring Interventions: -- Hgb A1c 5.7 (prediabetes); lipid profile 11/16/21 revealing for elevated LDL  -- Repeat CBC, CMP, metabolic labs obtained in ED and WNL.  # Vitamin D deficiency Status of problem: acute Interventions: -- Vitamin D 12.8 11/16/21  -- Repeat level ordered -- Patient currently not compliant with Vitamin D3 1000 IU daily  # General health maintenance # Persistently elevated BP Status of problem: acute; BP 125/83 at [facility] 3/8, but 140/62 today Interventions: -- Referral previously placed to internal medicine; pending review - placed new referral today given persistently elevated blood pressure readings on clinic visits  Patient was given contact information for behavioral health clinic and was instructed to call 911 for emergencies.   Subjective:  Chief Complaint:  Chief Complaint  Patient presents with   Follow-up    After significant ED visit   Manic Behavior   Medication Problem    Semi-compliance    Interval History:   Reports that last Friday, she was hospitalized after hearing or feeling something negative, that she "rebuked in the name of Jesus" aloud. She declined going to the hospital when asked by her supervisor. This was out of the ordinary for her supervisor. EMS was called, and she gave them a name that wasn't hers in order to prevent hospitalization, which she stated that she did not need. She was in the restroom, and felt barricaded. She attempted to lock  the door, but EMS came into the room.   She began spitting, similarly to the female with whom she works, and had to be restrained. She was hospitalized at [facility].   She reports semi-compliance with medications. She takes it on days that she has to work the next day, but now: Monday, Tuesday, and Friday-Sunday. She also does not take it when going out and will have an alcoholic beverage.   She has been sleeping well recently. Thursday night, stayed up until 2 AM, only 3-4 hours of sleep. Her mom told her that her daughter could not stay with her to safely make it to the bus stop and that she could go to the neighbors' house. She was worried about her daughter that night. She did not want her daughter to be out of her care.   She felt as though she was going into a "different stratosphere." She wsa in the breakroom intially, and then went outside. She asked for time, but   She believes the Lybalvi to be working and without episodes since 2022. She also just had a transition of work shift in December. She was given a work release form.   While admitted, she had a dose of Haldol and Zolpidem, which was beneficial.   She has been compliant with Lybalvi since Friday.   Work release would need to include the following:  Safety to drive,  all medications have been taken, operation of heavy machinery, safe operation of forklifts.   Patient denies AVH, paranoia, SI, or HI since the incident.  At [facility], UDS was positive for BZD (given by EMS). Haldol and Versed given by EMS. Labs were otherwise WNL.  Per Psych MD, pt presented erratic, bizarre, and hyperreligious, introducing herself as three different people. She was fixated on discharge. Collateral from boyfriend notes that this behavior became apparent about a month ago, in which patient has frequently discussed "spiritual warfare" as well as a sense of impending doom regarding her family's future. She also mentioned that she had not been sleeping well,  average of 4-5 hours per night, nor eating well. She quickly cleared on assessment the next morning after a dose of Haldol and Ambien and was subsequently discharged without requiring inpatient admission.     Collateral: Jetta Lout [phone])- 10:01 AM, No answer, left HIPAA compliant VM. 11:22 AM, called again, no answer.  7:28 PM- reports things seem fine from his perspective. He believes that she is back to baseline. She is sleeping well. She is no longer making bizarre statements. He denies her experiencing AVH since Friday, in which he did acknowledge that she was reporting both seeing things others did not and concern for her hearing voices. He denies patient reporting to him sense of impending doom toward herself or family since returning home. She told him that she is compliant with medications. He has not witnessed her taking her medications, but she is usually forthcoming about doing so. Additionally, he notes the improvements in her behaviors and thoughts, so he believes her when she informs him that she is compliant. He has no safety concerns regarding Stephanie Golden.   Visit Diagnosis:    ICD-10-CM   1. 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         Past Psychiatric History:  Diagnoses: unspecified psychotic disorder (first diagnosed in Feb 2022) Medication trials: olanzapine, trazodone Hospitalizations: presented to ED 09/13/20 under IVC with [facility] Substance use:   -- Etoh: socially  -- Tobacco: Black and Milds cigars x2 daily  -- Denies illicit drug use  Past Medical History:  Past Medical History:  Diagnosis Date   H/O candidiasis 02/22/2011   yeast   H/O rubella 02/22/2011   yeast   H/O varicella    Involuntary commitment    No pertinent past medical history    Schizophrenia (HCC)     Past Surgical History:  Procedure Laterality Date   CESAREAN SECTION  08/07/2011   Procedure: CESAREAN SECTION;   Surgeon: Kirkland Hun, MD;  Location: [facility];  Service: Gynecology;  Laterality: N/A;  Primary cesarean section with delivery of baby girl at 68. Apgars 9/9.   stitches to r eye      Family Psychiatric History:  Uncle (maternal): Schizophrenia, diagnosed in his early 77s as well. He was able to live alone for some time, unfortunately was not very compliant and is currently living in a group home facility.   Family History:  Family History  Problem Relation Age of Onset   Drug abuse Father    Diabetes Maternal Uncle    Hypertension Maternal Uncle    Cancer Paternal Aunt    Cancer Maternal Grandmother    Heart disease Paternal Grandmother    Hypertension Paternal Grandmother    Diabetes Paternal Grandmother    Hypertension Maternal Grandfather    Anesthesia problems Neg Hx  Hypotension Neg Hx    Malignant hyperthermia Neg Hx    Pseudochol deficiency Neg Hx     Social History:  Social History   Socioeconomic History   Marital status: Single    Spouse name: Not on file   Number of children: Not on file   Years of education: Not on file   Highest education level: Not on file  Occupational History   Not on file  Tobacco Use   Smoking status: Every Day    Types: Cigars, Cigarettes   Smokeless tobacco: Never   Tobacco comments:    Smokes Black and Mild Cigars x2 daily  Substance and Sexual Activity   Alcohol use: No   Drug use: No   Sexual activity: Yes    Birth control/protection: None  Other Topics Concern   Not on file  Social History Narrative   Not on file   Social Drivers of Health   Financial Resource Strain: Not on file  Food Insecurity: Not on file  Transportation Needs: Not on file  Physical Activity: Not on file  Stress: Not on file  Social Connections: Not on file    Allergies: No Known Allergies  Current Medications: Current Outpatient Medications  Medication Sig Dispense Refill   OLANZapine-Samidorphan (LYBALVI) 5-10 MG TABS Take 2.5-5  mg by mouth daily in the afternoon.     No current facility-administered medications for this visit.    ROS: Reports increased appetite  Objective:  Psychiatric Specialty Exam: Blood pressure (!) 140/62, pulse 67, height 5\' 3"  (1.6 m), weight 173 lb 3.2 oz (78.6 kg), SpO2 100%.Body mass index is 30.68 kg/m.  General Appearance: Casual and Well Groomed  Eye Contact:  Good  Speech:  Clear and Coherent and Normal Rate  Volume:  Normal  Mood:   "good, but I really need to get back to work"  Affect:  Appropriate and Euthymic  Thought Content:  Denies AVH; IOR.  Denies paranoia.  Since Friday, 3/7  Suicidal Thoughts:  No  Homicidal Thoughts:  No  Thought Process:  Goal Directed and Linear  Orientation:  Full (Time, Place, and Person)    Memory:   Grossly intact  Judgment:  Fair  Insight:  Good  Concentration:  Concentration: Good  Recall:  NA  Fund of Knowledge: Good  Language: Good  Psychomotor Activity:  Normal  Akathisia:  No  AIMS (if indicated): not done  Assets:  Communication Skills Desire for Improvement Housing Leisure Time Physical Health Resilience Social Support Talents/Skills Transportation Vocational/Educational  ADL's:  Intact  Cognition: WNL  Sleep:  Good, since Saturday   PE: General: well-appearing; no acute distress  Pulm: no increased work of breathing on room air  Strength & Muscle Tone: within normal limits Neuro: no focal neurological deficits observed  Gait & Station: normal  Metabolic Disorder Labs: Lab Results  Component Value Date   HGBA1C 5.7 (H) 11/16/2021   No results found for: "PROLACTIN" Lab Results  Component Value Date   CHOL 184 11/16/2021   TRIG 85 11/16/2021   HDL 44 11/16/2021   CHOLHDL 4.2 11/16/2021   LDLCALC 124 (H) 11/16/2021   Lab Results  Component Value Date   TSH 1.570 11/16/2021   Wt Readings from Last 3 Encounters:  03/22/23 173 lb 3.2 oz (78.6 kg)  03/07/23 175 lb (79.4 kg)  12/20/22 177 lb 6.4 oz  (80.5 kg)   Therapeutic Level Labs: No results found for: "LITHIUM" No results found for: "VALPROATE" No results found for: "  CBMZ"  Screenings: GAD-7    Flowsheet Row Office Visit from 09/03/2021 in [facility] Office Visit from 07/02/2021 in [facility] Office Visit from 04/30/2021 in [facility] Office Visit from 03/03/2021 in [facility] Clinical Support from 12/30/2020 in [facility]  Total GAD-7 Score 9 5 6 6 10       PHQ2-9    Flowsheet Row Office Visit from 09/03/2021 in [facility] Office Visit from 07/02/2021 in [facility] Office Visit from 04/30/2021 in [facility] Office Visit from 03/03/2021 in [facility] Clinical Support from 12/30/2020 in [facility]  PHQ-2 Total Score 5 2 2 2 2   PHQ-9 Total Score 13 4 8 6 10       Flowsheet Row ED from 03/06/2022 in [facility] Visit from 09/03/2021 in [facility] Office Visit from 07/02/2021 in [facility]  C-SSRS RISK CATEGORY No Risk No Risk No Risk       Collaboration of Care: Collaboration of Care: Medication Management AEB ongoing medication management and Psychiatrist AEB established with Dr. Josephina Shih, with whom her case was discussed along with Dr. Adrian Blackwater  Patient/Guardian was advised Release of Information must be obtained prior to any record release in order to collaborate their care with an outside provider. Patient/Guardian was advised if they have not already done so to contact the registration department to sign all necessary forms in order for [place] to release information regarding their care.   Consent: Patient/Guardian gives verbal consent for treatment and  assignment of benefits for services provided during this visit. Patient/Guardian expressed understanding and agreed to proceed.   A total of 90 minutes was spent involved in face to face clinical care, chart review, documentation, coordination with case management, and medication management.   Lamar Sprinkles, MD 03/22/23

## 2023-03-24 DIAGNOSIS — Z79899 Other long term (current) drug therapy: Secondary | ICD-10-CM | POA: Insufficient documentation

## 2023-03-24 DIAGNOSIS — E559 Vitamin D deficiency, unspecified: Secondary | ICD-10-CM | POA: Insufficient documentation

## 2023-03-24 NOTE — Addendum Note (Signed)
 Addended by: Tia Masker on: 03/24/2023 10:57 AM   Modules accepted: Level of Service

## 2023-03-30 ENCOUNTER — Ambulatory Visit (INDEPENDENT_AMBULATORY_CARE_PROVIDER_SITE_OTHER): Admitting: Student

## 2023-03-30 DIAGNOSIS — Z79899 Other long term (current) drug therapy: Secondary | ICD-10-CM

## 2023-03-30 DIAGNOSIS — F209 Schizophrenia, unspecified: Secondary | ICD-10-CM

## 2023-03-30 NOTE — Progress Notes (Signed)
 BH MD Outpatient Progress Note  03/30/23 9:37 AM  [name]  MRN:  [id number]  Assessment:  Stephanie Golden presents for follow-up evaluation in-person after ED visit on 3/7-3/8, and walk-in visit on 3/12. Today, 03/30/23, patient presented for follow-up after returning to work 1 week ago following a severe psychotic episode. Patient maintains that she has been medication compliant. She does report chronic AH, which she denied last week, but states that the voice have been more positive.  She denies feeling the darkness since maintaining compliance with her medications.  She also reports that she plans to continue to maintain compliance as prescribed so as not to disrupt her job.    Patient with hypertensive BP, and she is advised to follow up on this with PCP.  Patient poses no safety concerns toward herself nor others at this time.  We will follow up in about a month to ensure that patient is still maintaining compliance with medication, and that medication regimen remains effective for symptoms.  Identifying Information: Stephanie Golden is a 38 y.o. female with a history of schizophrenia who is an established patient with [facility] for management of symptoms of psychosis.   Plan:  # Schizophrenia Past medication trials: Zyprexa (weight gain) Status of problem: stable Interventions: -- Continue Lybalvi 5-10 mg nightly, as prescribed -- Reports need of samples  -- Extensively discussed option and recommendation to switch to more weight neutral antipsychotic such as Abilify however patient has declined   # Weight gain on antipsychotic: Zyprexa started Feb 2022; weight gain from 153 lbs July 2022 -> 193 lbs Jan 2024 -> 189 lbs 04/21/22 -> metformin started April 2024 -> 180 lbs 06/16/22 -> 172 lbs 10/18/22 -> 177 lbs 12/20/22 ->  175 lbs 03/07/23 Past medication trials: none prior Status of problem: stable Interventions: -- Patient on Lybalvi as above -- DEFER  metformin for time being given noncompliance; repeat labs as below -- Patient to continue lifestyle interventions including balanced diet and incorporating daily exercise  # Medication monitoring Interventions: -- Hgb A1c 5.7 (prediabetes); lipid profile 11/16/21 revealing for elevated LDL  -- Repeat CBC, CMP, metabolic labs obtained in ED and WNL.  # Vitamin D deficiency Status of problem: acute Interventions: -- Vitamin D 12.8 11/16/21  -- Repeat level ordered -- Patient currently not compliant with Vitamin D3 1000 IU daily  # General health maintenance # Persistently elevated BP Status of problem: acute; BP 125/83 at [facility] 3/8, but 140/62 on 3/12 Interventions: -- Referral previously placed to internal medicine; pending review  Patient was given contact information for behavioral health clinic and was instructed to call 911 for emergencies.   We will follow up with patient in approximately 1 months to ensure stability ahead of follow up with Dr. Josephina Shih on 5/20.  Subjective:  Chief Complaint:  Chief Complaint  Patient presents with   Follow-up   Schizophrenia   Hallucinations    Interval History: Patient reports that she has been doing better overall since hospitalization. When she returned to work, she discussed events with her supervisor. She knows her supervisor is still cautious and wants to maintain safety of all employees. She is functioning well at work. She always hears positive voices, not as loud. Feels safe. When she does not know where they are coming from, rebukes them. Sometimes can be more clear. Not as much confusion. When they begin to get darker, distorted faces, and negative voices.   She is taking medications nightly,  as she does not want medications to hinder her ability to work. She denies floridly psychotic sx since last visit. Taking full pill at bedtime. Missed one dose when drank alcohol. Denies VH. Denies paranoia since last Friday (believed mom was  poisoning her food). She is able to remind herself that things are not.   Denies SI, HI.    Visit Diagnosis:    ICD-10-CM   1. Schizophrenia, unspecified type (HCC)  F20.9     2. Long term current use of antipsychotic medication  Z79.899     3. High risk medication use  Z79.899          Past Psychiatric History:  Diagnoses: unspecified psychotic disorder (first diagnosed in Feb 2022) Medication trials: olanzapine, trazodone Hospitalizations: presented to ED 09/13/20 under IVC with AH Substance use:   -- Etoh: socially  -- Tobacco: Black and Milds cigars x2 daily  -- Denies illicit drug use  Past Medical History:  Past Medical History:  Diagnosis Date   H/O candidiasis 02/22/2011   yeast   H/O rubella 02/22/2011   yeast   H/O varicella    Involuntary commitment    No pertinent past medical history    Schizophrenia (HCC)     Past Surgical History:  Procedure Laterality Date   CESAREAN SECTION  08/07/2011   Procedure: CESAREAN SECTION;  Surgeon: Kirkland Hun, MD;  Location: [facility];  Service: Gynecology;  Laterality: N/A;  Primary cesarean section with delivery of baby girl at 63. Apgars 9/9.   stitches to r eye      Family Psychiatric History:  Uncle (maternal): Schizophrenia, diagnosed in his early 64s as well. He was able to live alone for some time, unfortunately was not very compliant and is currently living in a group home facility.   Family History:  Family History  Problem Relation Age of Onset   Drug abuse Father    Diabetes Maternal Uncle    Hypertension Maternal Uncle    Cancer Paternal Aunt    Cancer Maternal Grandmother    Heart disease Paternal Grandmother    Hypertension Paternal Grandmother    Diabetes Paternal Grandmother    Hypertension Maternal Grandfather    Anesthesia problems Neg Hx    Hypotension Neg Hx    Malignant hyperthermia Neg Hx    Pseudochol deficiency Neg Hx     Social History:  Social History   Socioeconomic History    Marital status: Single    Spouse name: Not on file   Number of children: Not on file   Years of education: Not on file   Highest education level: Not on file  Occupational History   Not on file  Tobacco Use   Smoking status: Every Day    Types: Cigars, Cigarettes   Smokeless tobacco: Never   Tobacco comments:    Smokes Black and Mild Cigars x2 daily  Substance and Sexual Activity   Alcohol use: No   Drug use: No   Sexual activity: Yes    Birth control/protection: None  Other Topics Concern   Not on file  Social History Narrative   Not on file   Social Drivers of Health   Financial Resource Strain: Not on file  Food Insecurity: Not on file  Transportation Needs: Not on file  Physical Activity: Not on file  Stress: Not on file  Social Connections: Not on file    Allergies: No Known Allergies  Current Medications: Current Outpatient Medications  Medication Sig Dispense Refill  OLANZapine-Samidorphan (LYBALVI) 5-10 MG TABS Take 2.5-5 mg by mouth daily in the afternoon.     No current facility-administered medications for this visit.    ROS: Reports increased appetite  Objective:  Psychiatric Specialty Exam: There were no vitals taken for this visit.There is no height or weight on file to calculate BMI.  General Appearance: Casual and Well Groomed  Eye Contact:  Good  Speech:  Clear and Coherent and Normal Rate  Volume:  Normal  Mood:   "good"  Affect:  Appropriate and Euthymic  Thought Content: Endorses auditory hallucinations, chronic  Suicidal Thoughts:  No  Homicidal Thoughts:  No  Thought Process:  Goal Directed and Linear  Orientation:  Full (Time, Place, and Person)    Memory:   Grossly intact  Judgment:  Fair  Insight:  Good  Concentration:  Concentration: Good  Recall:  NA  Fund of Knowledge: Good  Language: Good  Psychomotor Activity:  Normal  Akathisia:  No  AIMS (if indicated): not done  Assets:  Communication Skills Desire for  Improvement Housing Leisure Time Physical Health Resilience Social Support Talents/Skills Transportation Vocational/Educational  ADL's:  Intact  Cognition: WNL  Sleep:  Good   PE: General: well-appearing; no acute distress  Pulm: no increased work of breathing on room air  Strength & Muscle Tone: within normal limits Neuro: no focal neurological deficits observed  Gait & Station: normal  Metabolic Disorder Labs: Lab Results  Component Value Date   HGBA1C 5.7 (H) 11/16/2021   No results found for: "PROLACTIN" Lab Results  Component Value Date   CHOL 184 11/16/2021   TRIG 85 11/16/2021   HDL 44 11/16/2021   CHOLHDL 4.2 11/16/2021   LDLCALC 124 (H) 11/16/2021   Lab Results  Component Value Date   TSH 1.570 11/16/2021   Wt Readings from Last 3 Encounters:  03/22/23 173 lb 3.2 oz (78.6 kg)  03/07/23 175 lb (79.4 kg)  12/20/22 177 lb 6.4 oz (80.5 kg)   Therapeutic Level Labs: No results found for: "LITHIUM" No results found for: "VALPROATE" No results found for: "CBMZ"  Screenings: GAD-7    Flowsheet Row Office Visit from 09/03/2021 in [facility] Office Visit from 07/02/2021 in [facility] Office Visit from 04/30/2021 in [facility] Office Visit from 03/03/2021 in [facility] Clinical Support from 12/30/2020 in [facility]  Total GAD-7 Score 9 5 6 6 10       PHQ2-9    Flowsheet Row Office Visit from 09/03/2021 in [facility] Office Visit from 07/02/2021 in [facility] Office Visit from 04/30/2021 in [facility] Office Visit from 03/03/2021 in [facility] Clinical Support from 12/30/2020 in [facility]  PHQ-2 Total Score 5 2 2 2 2   PHQ-9 Total Score 13 4 8 6 10       Flowsheet Row ED  from 03/06/2022 in [facility] Visit from 09/03/2021 in [facility] Office Visit from 07/02/2021 in [facility]  C-SSRS RISK CATEGORY No Risk No Risk No Risk       Collaboration of Care: Collaboration of Care: Medication Management AEB ongoing medication management and Psychiatrist AEB established with Dr. Josephina Shih  Patient/Guardian was advised Release of Information must be obtained prior to any record release in order to collaborate their care  with an outside provider. Patient/Guardian was advised if they have not already done so to contact the registration department to sign all necessary forms in order for [place] to release information regarding their care.   Consent: Patient/Guardian gives verbal consent for treatment and assignment of benefits for services provided during this visit. Patient/Guardian expressed understanding and agreed to proceed.    Lamar Sprinkles, MD 03/30/23 9:37 AM

## 2023-04-12 ENCOUNTER — Other Ambulatory Visit (HOSPITAL_COMMUNITY): Payer: No Payment, Other

## 2023-05-02 ENCOUNTER — Ambulatory Visit (INDEPENDENT_AMBULATORY_CARE_PROVIDER_SITE_OTHER): Admitting: Student

## 2023-05-02 ENCOUNTER — Encounter (HOSPITAL_COMMUNITY): Payer: Self-pay | Admitting: Student

## 2023-05-02 VITALS — BP 156/94 | HR 60 | Ht 63.0 in | Wt 174.8 lb

## 2023-05-02 DIAGNOSIS — R7303 Prediabetes: Secondary | ICD-10-CM | POA: Diagnosis not present

## 2023-05-02 DIAGNOSIS — F209 Schizophrenia, unspecified: Secondary | ICD-10-CM | POA: Diagnosis not present

## 2023-05-02 DIAGNOSIS — E559 Vitamin D deficiency, unspecified: Secondary | ICD-10-CM

## 2023-05-02 DIAGNOSIS — Z79899 Other long term (current) drug therapy: Secondary | ICD-10-CM | POA: Diagnosis not present

## 2023-05-02 DIAGNOSIS — R030 Elevated blood-pressure reading, without diagnosis of hypertension: Secondary | ICD-10-CM

## 2023-05-02 NOTE — Progress Notes (Unsigned)
 BH MD Outpatient Progress Note  05/02/23 9:37 AM  KIRI HINDERLITER  MRN:  [id number]  Assessment:  Stephanie Golden presents for follow-up evaluation in-person. Today, 05/02/23, patient reports frustrations due to supervisor trying to dictate when she is appropriate to work and will not.  We talk through this at length, and patient is informed that her supervisor is doing the best that they know how with limited experience.  She does initially offer a rational thought, that she uses headphones while working, so she may have been on the phone or singing a song rather than talking to herself.  However, she goes on to state that she is still experiencing psychotic symptoms as she has been medication noncompliant.  She advises that she would like to take a more holistic approach to her care, but we discuss how we have all been in agreement with the fact that the medications work.  This is evidenced by the fact that she takes the medications when she "feels an episode coming on."  She is agreeable to restart medications with compliance, with the instructions to advise Dr. Eligio Grumbling if she is experiencing too much sedation on the current regimen.  She is given options including Abilify and Cobenfy in the future, and she is appreciative.    Patient still with hypertensive BP, and she is advised to follow up on this with PCP.  Referral sent again.  Patient to have repeat vitamin D  level obtained next Wednesday.  Patient poses no safety concerns toward herself nor others at this time.  Identifying Information: Stephanie Golden is a 38 y.o. female with a history of schizophrenia who is an established patient with [facility] for management of symptoms of psychosis.   Plan:  # Schizophrenia Past medication trials: Zyprexa  (weight gain) Status of problem: stable Interventions: -- Continue Lybalvi  5-10 mg nightly, as prescribed -- Reports no need of samples  -- Extensively discussed  option and recommendation to switch to more weight neutral antipsychotic such as Abilify or Cobenfy however patient has declined.  She will discuss with Dr. Eligio Grumbling during next visit if Lybalvi  continues to provide sedation  # Weight gain on antipsychotic: Zyprexa  started Feb 2022; weight gain from 153 lbs July 2022 -> 193 lbs Jan 2024 -> 189 lbs 04/21/22 -> metformin  started April 2024 -> 180 lbs 06/16/22 -> 172 lbs 10/18/22 -> 177 lbs 12/20/22 ->  175 lbs 03/07/23 Past medication trials: none prior Status of problem: stable; 174 pounds today Interventions: -- Patient on Lybalvi  as above -- DEFER metformin  for time being given noncompliance; repeat labs as below -- Patient to continue lifestyle interventions including balanced diet and incorporating daily exercise  # Medication monitoring Interventions: -- Hgb A1c 5.7 (prediabetes); lipid profile 11/16/21 revealing for elevated LDL  -- Repeat CBC, CMP, metabolic labs obtained at [facility] 03/2023 and WNL.  # Vitamin D  deficiency Status of problem: Chronic; Patient currently not compliant with Vitamin D3 1000 IU daily Interventions: -- Vitamin D  12.8 11/16/21  -- Repeat level ordered; patient to have obtained/30  # General health maintenance # Persistently elevated BP Status of problem: acute; BP 125/83 at [facility], but 140/62 on 3/12.  156/94 today. Interventions: -- Referral previously placed to internal medicine; new referral placed and pending review  Patient was given contact information for behavioral health clinic and was instructed to call 911 for emergencies.   We will follow up with patient in approximately 1 months to ensure stability ahead  of follow up with Dr. Eligio Grumbling on 5/20.  Subjective:  Chief Complaint:  Chief Complaint  Patient presents with   Follow-up   Medication Refill    Interval History: Patient reports frustration at work after she was told by supervisor that there were concerns for a  psychotic episode after patient was observed. talking to herself. She is worried that she will be required to sue due to supervisor making medical decisions on her behalf. She was sent home 30 minutes- 1 hour into her shift. She had begun working, unlike during her last episode.   She is able to speak rationally about having her work release.   She reports that she was stopped in the middle of an assignment. She was told that she was observed talking to herself. She had her airpods in and was listening to podcasts, or talking on the phone. She denies psychotic sx at the time. She plans to speak to HR; not yet done.   She denies problems sleeping.   No longer compliant with medications. Wants to do "something more holistic." She insists she has not had a manic or psychotic episode since last one, but she goes on to describe chronic psychotic symptoms that have not dissipated.   Denies SI, HI. AH of people.  She reports that she hears people having conversations, sometimes about her, other times about other people.  Men at work will show interest in her, and after she declines, she can hear them talking about her while they are at work and even when she goes home.  She reports the same about people at church. Denies paranoia, but later states that she has the feeling that people are talking about her, as she can still hear their conversations even when she is not in the presence.  Tobacco: 3-4 Black and Mild Cigars daily. Alcohol: Socially Illicit: Denies  Lybalvi  has a lot on hand, approx 20 bottles with 7 tablets in each vial.    Visit Diagnosis:    ICD-10-CM   1. Schizophrenia, unspecified type (HCC)  F20.9     2. Long term current use of antipsychotic medication  Z79.899 Ambulatory Referral to Primary Care    3. High risk medication use  Z79.899     4. Elevated blood pressure reading  R03.0 Ambulatory Referral to Primary Care    5. Prediabetes  R73.03 Ambulatory Referral to Primary  Care    6. Vitamin D  deficiency  E55.9 Vitamin D  (25 hydroxy)          Past Psychiatric History:  Diagnoses: unspecified psychotic disorder (first diagnosed in Feb 2022) Medication trials: olanzapine , trazodone  Hospitalizations: presented to ED 09/13/20 under IVC with AH Substance use:   -- Etoh: socially  -- Tobacco: Black and Milds cigars x2 daily  -- Denies illicit drug use  Past Medical History:  Past Medical History:  Diagnosis Date   H/O candidiasis 02/22/2011   yeast   H/O rubella 02/22/2011   yeast   H/O varicella    Involuntary commitment    No pertinent past medical history    Schizophrenia (HCC)     Past Surgical History:  Procedure Laterality Date   CESAREAN SECTION  08/07/2011   Procedure: CESAREAN SECTION;  Surgeon: Lula Sale, MD;  Location: [facility];  Service: Gynecology;  Laterality: N/A;  Primary cesarean section with delivery of baby girl at 24. Apgars 9/9.   stitches to r eye      Family Psychiatric History:  Uncle (maternal): Schizophrenia, diagnosed  in his early 30s as well. He was able to live alone for some time, unfortunately was not very compliant and is currently living in a group home facility.   Family History:  Family History  Problem Relation Age of Onset   Drug abuse Father    Diabetes Maternal Uncle    Hypertension Maternal Uncle    Cancer Paternal Aunt    Cancer Maternal Grandmother    Heart disease Paternal Grandmother    Hypertension Paternal Grandmother    Diabetes Paternal Grandmother    Hypertension Maternal Grandfather    Anesthesia problems Neg Hx    Hypotension Neg Hx    Malignant hyperthermia Neg Hx    Pseudochol deficiency Neg Hx     Social History:  Social History   Socioeconomic History   Marital status: Single    Spouse name: Not on file   Number of children: Not on file   Years of education: Not on file   Highest education level: Not on file  Occupational History   Not on file  Tobacco Use    Smoking status: Every Day    Types: Cigars, Cigarettes   Smokeless tobacco: Never   Tobacco comments:    Smokes Black and Mild Cigars x2 daily  Substance and Sexual Activity   Alcohol use: No   Drug use: No   Sexual activity: Yes    Birth control/protection: None  Other Topics Concern   Not on file  Social History Narrative   Not on file   Social Drivers of Health   Financial Resource Strain: Not on file  Food Insecurity: Not on file  Transportation Needs: Not on file  Physical Activity: Not on file  Stress: Not on file  Social Connections: Not on file    Allergies: No Known Allergies  Current Medications: Current Outpatient Medications  Medication Sig Dispense Refill   OLANZapine -Samidorphan (LYBALVI ) 5-10 MG TABS Take 2.5-5 mg by mouth daily in the afternoon.     No current facility-administered medications for this visit.    ROS: Denies physical concerns or complaints  Objective:  Psychiatric Specialty Exam: Blood pressure (!) 156/94, pulse 60, height 5\' 3"  (1.6 m), weight 174 lb 12.8 oz (79.3 kg).Body mass index is 30.96 kg/m.  General Appearance: Casual and Well Groomed  Eye Contact:  Good  Speech:  Clear and Coherent and Normal Rate  Volume:  Normal  Mood:   "Frustrated with my supervisor trying to affect my livelihood"  Affect:  Congruent and irritable  Thought Content: Endorses auditory hallucinations and some paranoia, chronic  Suicidal Thoughts:  No  Homicidal Thoughts:  No  Thought Process:  Goal Directed and Linear  Orientation:  Full (Time, Place, and Person)    Memory:   Grossly intact  Judgment:  Fair  Insight:  Lacking and Shallow  Concentration:  Concentration: Good  Recall:  NA  Fund of Knowledge: Good  Language: Good  Psychomotor Activity:  Normal  Akathisia:  No  AIMS (if indicated): not done  Assets:  Communication Skills Desire for Improvement Housing Leisure Time Physical Health Resilience Social  Support Talents/Skills Transportation Vocational/Educational  ADL's:  Intact  Cognition: WNL  Sleep:  Good   PE: General: well-appearing; no acute distress  Pulm: no increased work of breathing on room air  Strength & Muscle Tone: within normal limits Neuro: no focal neurological deficits observed  Gait & Station: normal  Metabolic Disorder Labs: Lab Results  Component Value Date   HGBA1C 5.7 (H) 11/16/2021  No results found for: "PROLACTIN" Lab Results  Component Value Date   CHOL 184 11/16/2021   TRIG 85 11/16/2021   HDL 44 11/16/2021   CHOLHDL 4.2 11/16/2021   LDLCALC 124 (H) 11/16/2021   Lab Results  Component Value Date   TSH 1.570 11/16/2021   Wt Readings from Last 3 Encounters:  05/02/23 174 lb 12.8 oz (79.3 kg)  03/22/23 173 lb 3.2 oz (78.6 kg)  03/07/23 175 lb (79.4 kg)   Therapeutic Level Labs: No results found for: "LITHIUM" No results found for: "VALPROATE" No results found for: "CBMZ"  Screenings: GAD-7    Flowsheet Row Office Visit from 09/03/2021 in [facility] Office Visit from 07/02/2021 in [facility] Office Visit from 04/30/2021 in [facility] Office Visit from 03/03/2021 in [facility] Clinical Support from 12/30/2020 in [facility]  Total GAD-7 Score 9 5 6 6 10       PHQ2-9    Flowsheet Row Office Visit from 09/03/2021 in [facility] Office Visit from 07/02/2021 in [facility] Office Visit from 04/30/2021 in [facility] Office Visit from 03/03/2021 in [facility] Clinical Support from 12/30/2020 in [facility]  PHQ-2 Total Score 5 2 2 2 2   PHQ-9 Total Score 13 4 8 6 10       Flowsheet Row ED from 03/06/2022 in [facility] Visit  from 09/03/2021 in [facility] Office Visit from 07/02/2021 in [facility]  C-SSRS RISK CATEGORY No Risk No Risk No Risk       Collaboration of Care: Collaboration of Care: Dr. Cathyann Cobia Medication Management AEB ongoing medication management and Psychiatrist AEB established with Dr. Eligio Grumbling  Patient/Guardian was advised Release of Information must be obtained prior to any record release in order to collaborate their care with an outside provider. Patient/Guardian was advised if they have not already done so to contact the registration department to sign all necessary forms in order for us  to release information regarding their care.   Consent: Patient/Guardian gives verbal consent for treatment and assignment of benefits for services provided during this visit. Patient/Guardian expressed understanding and agreed to proceed.    Shery Done, MD 05/02/23 9:37 AM

## 2023-05-03 DIAGNOSIS — R030 Elevated blood-pressure reading, without diagnosis of hypertension: Secondary | ICD-10-CM | POA: Insufficient documentation

## 2023-05-03 DIAGNOSIS — R7303 Prediabetes: Secondary | ICD-10-CM | POA: Insufficient documentation

## 2023-05-10 ENCOUNTER — Other Ambulatory Visit (HOSPITAL_COMMUNITY)

## 2023-05-10 DIAGNOSIS — E559 Vitamin D deficiency, unspecified: Secondary | ICD-10-CM

## 2023-05-10 DIAGNOSIS — Z79899 Other long term (current) drug therapy: Secondary | ICD-10-CM | POA: Diagnosis not present

## 2023-05-10 DIAGNOSIS — F209 Schizophrenia, unspecified: Secondary | ICD-10-CM

## 2023-05-10 NOTE — Progress Notes (Signed)
 Pt tolerated labs well in right ARM with no complaints.    JNL, CMA

## 2023-05-11 ENCOUNTER — Other Ambulatory Visit (HOSPITAL_COMMUNITY): Payer: Self-pay | Admitting: Psychiatry

## 2023-05-11 ENCOUNTER — Encounter (HOSPITAL_COMMUNITY): Payer: Self-pay

## 2023-05-11 ENCOUNTER — Other Ambulatory Visit: Payer: Self-pay

## 2023-05-11 LAB — VITAMIN D 25 HYDROXY (VIT D DEFICIENCY, FRACTURES): Vit D, 25-Hydroxy: 9.7 ng/mL — ABNORMAL LOW (ref 30.0–100.0)

## 2023-05-11 MED ORDER — VITAMIN D (ERGOCALCIFEROL) 1.25 MG (50000 UNIT) PO CAPS
50000.0000 [IU] | ORAL_CAPSULE | ORAL | 0 refills | Status: DC
  Filled 2023-05-11: qty 8, 56d supply, fill #0
  Filled 2023-06-02: qty 4, 28d supply, fill #0

## 2023-05-22 ENCOUNTER — Other Ambulatory Visit: Payer: Self-pay

## 2023-05-29 NOTE — Progress Notes (Signed)
 [facility] MD Outpatient Progress Note  05/30/23 Stephanie Golden  MRN:  [id number]  Assessment:  Stephanie Golden presents for follow-up evaluation in-person. Today, 05/30/23, patient reports resolution of prior symptoms of psychosis although of note continues to minimize prior episode in March and has been guarded regarding full extent of symptoms. Reassuringly, she is functioning well in interpersonal and vocational domains per her report and recently started an additional part-time job for extra income. She initially reports adherence to Lybalvi  however later states she has skipped the last few nights due to daytime sedation; suspicion for limited compliance overall. Patient is now amenable to transition to Abilify given lower risk for fatigue and weight gain. Extensively reviewed plan for cross titration as below and encouraged daily adherence. No acute safety concerns at this time.   RTC in 2 months by video. Will send MyChart message in 3 weeks to check in on medication change.  Identifying Information: Stephanie Golden is a 38 y.o. female with a history of schizophrenia who is an established patient with [facility] for management of symptoms of psychosis. During periods of decompensation, patient demonstrates bizarre and disorganized behaviors and thought process, hyperreligiosity, and AVH.   Plan:  # Schizophrenia, r/o schizoaffective disorder Past medication trials: Lybalvi  (weight gain, sedation) Status of problem: stable Interventions: -- CROSS TITRATE from Lybalvi  to Abilify -- DECREASE Lybalvi  to 2.5-5 mg nightly for 1 week then STOP -- START Abilify 5 mg in the morning for 1 week then INCREASE to 10 mg in the morning -- Risks, benefits, and side effects including but not limited to akathisia, metabolic disturbance, tardive dyskinesia, sleep disturbance were reviewed with informed consent provided.   # Weight gain on antipsychotic: Zyprexa  started Feb 2022; weight  gain from 153 lbs July 2022 -> 193 lbs Jan 2024 -> 177 lbs 12/20/22 -> 172 lbs (05/30/23) Past medication trials: none prior Status of problem: stable Interventions: -- DEFER metformin  for time being given prior noncompliance and transition to Abilify -- Patient to continue lifestyle interventions including balanced diet and incorporating daily exercise  # Medication monitoring Interventions: -- Hgb A1c 5.7 (prediabetes); lipid profile 11/16/21 revealing for elevated LDL; patient in need of updated metabolic monitoring; will address at next appointment  # Vitamin D  deficiency Status of problem: acute Interventions: -- Vitamin D  9.7 05/10/23  -- START Vitamin D  50000 IU weekly (s5/20) with plan to transition to 1000 IU daily thereafter  # General health maintenance # Persistently elevated BP Status of problem: acute Interventions: -- Referral previously placed to internal medicine; still pending review  Patient was given contact information for behavioral health clinic and was instructed to call 911 for emergencies.   Subjective:  Chief Complaint:  Chief Complaint  Patient presents with   Medication Management    Interval History:  Stephanie Golden shares frustration with episode in which she feels she was sent home from work unnecessarily due to talking to herself (shares she had airpods in and was singing to music). Didn't talk to HR as she felt she was being treated unfairly and it wouldn't be helpful. Continues to work at same job; feels interactions with supervisor have been going alright. No further episodes of being sent home and has been able to fulfill work responsibilities.   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   Describes mood overall as "pretty good" - denies periods of persistent depression or excessive elevation of  mood. Denies SI/HI. Denies AVH however denies experiencing negative AH which is in opposition  to prior accounts. Reports she considers herself spiritual and may have instances in which she feels "connected to others." Denies thought insertion or broadcasting. Reports history of IOR but denies recently. Has not noticed mind "slipping into another dimension" lately.   Sleeping well at night with about 7 hours nightly; denies issues falling or staying asleep.   Reports nightly adherence to Lybalvi  however soon after states she skipped the last few nights due to morning sedation.   Discussed option of transitioning from Lybalvi  to Abilify at this time. She is amenable to transition given sedation, weight gain, and lack of accessibility to Lybalvi . All questions/concerns addressed.   Visit Diagnosis:    ICD-10-CM   1. Schizophrenia, unspecified type (HCC)  F20.9     2. High risk medication use  Z79.899     3. Vitamin D  deficiency  E55.9     4. Drug-induced weight gain  R63.5    T50.905A       Past Psychiatric History:  Diagnoses: schizophrenia vs. Schizoaffective disorder (first psychotic episode in Feb 2022) Medication trials: olanzapine , trazodone  Hospitalizations: presented to ED 09/13/20 under IVC with [facility]; psychiatric ED March 2025 for psychosis and bizarre behaviors Substance use:   -- Etoh: socially  -- Tobacco: Black and Milds cigars x2 daily  -- Denies illicit drug use  Past Medical History:  Past Medical History:  Diagnosis Date   H/O candidiasis 02/22/2011   yeast   H/O rubella 02/22/2011   yeast   H/O varicella    Involuntary commitment    No pertinent past medical history    Schizophrenia (HCC)     Past Surgical History:  Procedure Laterality Date   CESAREAN SECTION  08/07/2011   Procedure: CESAREAN SECTION;  Surgeon: Lula Sale, MD;  Location: [facility];  Service: Gynecology;  Laterality: N/A;  Primary cesarean section with delivery of baby girl at 89. Apgars 9/9.   stitches to r eye      Family Psychiatric History:  Uncle (maternal): Schizophrenia,  diagnosed in his early 17s as well. He was able to live alone for some time, unfortunately was not very compliant and is currently living in a group home facility.   Family History:  Family History  Problem Relation Age of Onset   Drug abuse Father    Diabetes Maternal Uncle    Hypertension Maternal Uncle    Cancer Paternal Aunt    Cancer Maternal Grandmother    Heart disease Paternal Grandmother    Hypertension Paternal Grandmother    Diabetes Paternal Grandmother    Hypertension Maternal Grandfather    Anesthesia problems Neg Hx    Hypotension Neg Hx    Malignant hyperthermia Neg Hx    Pseudochol deficiency Neg Hx     Social History:  Social History   Socioeconomic History   Marital status: Single    Spouse name: Not on file   Number of children: Not on file   Years of education: Not on file   Highest education level: Not on file  Occupational History   Not on file  Tobacco Use   Smoking status: Every Day    Types: Cigars, Cigarettes   Smokeless tobacco: Never   Tobacco comments:    Smokes Black and Mild Cigars x2 daily  Substance and Sexual Activity   Alcohol use: No   Drug use: No   Sexual activity: Yes    Birth control/protection:  None  Other Topics Concern   Not on file  Social History Narrative   Not on file   Social Drivers of Health   Financial Resource Strain: Not on file  Food Insecurity: Not on file  Transportation Needs: Not on file  Physical Activity: Not on file  Stress: Not on file  Social Connections: Not on file    Allergies: No Known Allergies  Current Medications: Current Outpatient Medications  Medication Sig Dispense Refill   ARIPiprazole (ABILIFY) 10 MG tablet Take 1/2 tablet (5 mg total) in the morning for 1 week then INCREASE to 1 tablet (10 mg total) in the morning thereafter 30 tablet 2   Vitamin D , Ergocalciferol , (DRISDOL ) 1.25 MG (50000 UNIT) CAPS capsule Take 1 capsule (50,000 Units total) by mouth every 7 (seven) days.  (Patient not taking: Reported on 05/30/2023) 8 capsule 0   No current facility-administered medications for this visit.    ROS: Reports daytime sedation  Objective:  Psychiatric Specialty Exam: Blood pressure (!) 140/90, pulse 90, height 5\' 3"  (1.6 m), weight 172 lb 1.6 oz (78.1 kg), SpO2 100%.Body mass index is 30.49 kg/m.  General Appearance: Casual and Well Groomed  Eye Contact:  Good  Speech:  Clear and Coherent and Normal Rate  Volume:  Normal  Mood:  "good"  Affect:  Appropriate and Euthymic  Thought Content: Denies AVH; IOR.  Denies paranoia. Reports episodes of feeling "spiritually connected" to others   Suicidal Thoughts:  No  Homicidal Thoughts:  No  Thought Process:  Goal Directed and Linear  Orientation:  Full (Time, Place, and Person)    Memory:  Grossly intact  Judgment:  Fair  Insight:  Fair  Concentration:  Concentration: Good  Recall:  NA  Fund of Knowledge: Good  Language: Good  Psychomotor Activity:  Normal  Akathisia:  No  AIMS (if indicated): not done  Assets:  Communication Skills Desire for Improvement Housing Leisure Time Physical Health Resilience Social Support Talents/Skills Transportation Vocational/Educational  ADL's:  Intact  Cognition: WNL  Sleep:  Good   PE: General: well-appearing; no acute distress  Pulm: no increased work of breathing on room air  Strength & Muscle Tone: within normal limits Neuro: no focal neurological deficits observed  Gait & Station: normal  Metabolic Disorder Labs: Lab Results  Component Value Date   HGBA1C 5.7 (H) 11/16/2021   No results found for: "PROLACTIN" Lab Results  Component Value Date   CHOL 184 11/16/2021   TRIG 85 11/16/2021   HDL 44 11/16/2021   CHOLHDL 4.2 11/16/2021   LDLCALC 124 (H) 11/16/2021   Lab Results  Component Value Date   TSH 1.570 11/16/2021   Wt Readings from Last 3 Encounters:  05/30/23 172 lb 1.6 oz (78.1 kg)  05/02/23 174 lb 12.8 oz (79.3 kg)  03/22/23 173  lb 3.2 oz (78.6 kg)   Therapeutic Level Labs: No results found for: "LITHIUM" No results found for: "VALPROATE" No results found for: "CBMZ"  Screenings: GAD-7    Flowsheet Row Office Visit from 09/03/2021 in [facility] Office Visit from 07/02/2021 in [facility] Office Visit from 04/30/2021 in [facility] Office Visit from 03/03/2021 in [facility] Clinical Support from 12/30/2020 in [facility]  Total GAD-7 Score 9 5 6 6 10       PHQ2-9    Flowsheet Row Office Visit from 09/03/2021 in [facility] Office Visit from  07/02/2021 in [facility] Office Visit from 04/30/2021 in [facility] Office Visit from 03/03/2021 in [facility] Clinical Support from 12/30/2020 in [facility]  PHQ-2 Total Score 5 2 2 2 2   PHQ-9 Total Score 13 4 8 6 10       Flowsheet Row UC from 03/06/2022 in [facility] Visit from 09/03/2021 in [facility] Office Visit from 07/02/2021 in [facility]  C-SSRS RISK CATEGORY No Risk No Risk No Risk       Collaboration of Care: Collaboration of Care: Medication Management AEB ongoing medication management and Psychiatrist AEB established with this provider  Patient/Guardian was advised Release of Information must be obtained prior to any record release in order to collaborate their care with an outside provider. Patient/Guardian was advised if they have not already done so to contact the registration department to sign all necessary forms in order for us  to release information regarding their care.   Consent: Patient/Guardian gives verbal consent for treatment and assignment of benefits for services provided  during this visit. Patient/Guardian expressed understanding and agreed to proceed.   A total of 35 minutes was spent involved in face to face clinical care, chart review, documentation, coordination with case management, and medication management.   [facility] A Kourtnee Lahey 05/30/23

## 2023-05-30 ENCOUNTER — Other Ambulatory Visit: Payer: Self-pay

## 2023-05-30 ENCOUNTER — Ambulatory Visit (INDEPENDENT_AMBULATORY_CARE_PROVIDER_SITE_OTHER): Payer: No Payment, Other | Admitting: Psychiatry

## 2023-05-30 ENCOUNTER — Encounter (HOSPITAL_COMMUNITY): Payer: Self-pay | Admitting: Psychiatry

## 2023-05-30 VITALS — BP 140/90 | HR 90 | Ht 63.0 in | Wt 172.1 lb

## 2023-05-30 DIAGNOSIS — E559 Vitamin D deficiency, unspecified: Secondary | ICD-10-CM | POA: Diagnosis not present

## 2023-05-30 DIAGNOSIS — R635 Abnormal weight gain: Secondary | ICD-10-CM

## 2023-05-30 DIAGNOSIS — F209 Schizophrenia, unspecified: Secondary | ICD-10-CM

## 2023-05-30 DIAGNOSIS — T50905A Adverse effect of unspecified drugs, medicaments and biological substances, initial encounter: Secondary | ICD-10-CM

## 2023-05-30 DIAGNOSIS — Z79899 Other long term (current) drug therapy: Secondary | ICD-10-CM

## 2023-05-30 MED ORDER — ARIPIPRAZOLE 10 MG PO TABS
ORAL_TABLET | ORAL | 2 refills | Status: DC
  Filled 2023-05-30: qty 30, 30d supply, fill #0

## 2023-05-30 NOTE — Patient Instructions (Signed)
 Thank you for attending your appointment today.  -- DECREASE Lybalvi  to 1/2 tablet at night. STOP after 1 week. -- START Abilify 1/2 tablet in the morning. After 1 week, INCREASE to full tablet.  -- Continue other medications as prescribed.  Please do not make any changes to medications without first discussing with your provider. If you are experiencing a psychiatric emergency, please call 911 or present to your nearest emergency department. Additional crisis, medication management, and therapy resources are included below.  [facility]  [address] [place], [place] [address] [phone] WALK-IN URGENT CARE 24/7 FOR ANYONE [address], [place], [place]  [phone] Fax: [phone] [email or web address] *Interpreters available *Accepts all insurance and uninsured for Urgent Care needs *Accepts Medicaid and uninsured for outpatient treatment (below)      ONLY FOR [facility]  Below:    Outpatient New Patient Assessment/Therapy Walk-ins:        Monday, Wednesday, and Thursday 8am until slots are full (first come, first served)                   New Patient Psychiatry/Medication Management        Monday-Friday 8am-11am (first come, first served)               For all walk-ins we ask that you arrive by 7:15am, because patients will be seen in the order of arrival.

## 2023-06-02 ENCOUNTER — Other Ambulatory Visit: Payer: Self-pay

## 2023-06-21 ENCOUNTER — Encounter (HOSPITAL_COMMUNITY): Payer: Self-pay

## 2023-07-25 NOTE — Progress Notes (Unsigned)
 [facility] MD Outpatient Progress Note  07/27/23 Stephanie Golden  MRN:  [id number]  Assessment:  Stephanie Golden presents for follow-up evaluation in-person. Today, 07/27/23, patient reports overall psychiatric stability upon switch to Abilify  however reports she did not increase to 10 mg as previously planned due to likely akathisia. She denies AVH, paranoia, or hyperreligiosity although notably patient can be guarded surrounding full extent of symptoms. She denies any vocational or interpersonal dysfunction. Discussed options for management including treatment of akathisia vs. switch to alternative psychoptric. Amenable to initiation of propranolol  at this time while maintaining Abilify  at current dosing.  This Clinical research associate noted possible uncontrollable mouth movements during interview today; patient denies awareness or that others have commented on this. Discussed plan for AIMS at next in person visit.  RTC in 7 weeks by video.  Identifying Information: Stephanie Golden is a 38 y.o. female with a history of schizophrenia who is an established patient with [facility] for management of symptoms of psychosis. During periods of decompensation, patient demonstrates bizarre and disorganized behaviors and thought process, hyperreligiosity, and AVH.   Plan:  # Schizophrenia, r/o schizoaffective disorder Past medication trials: Lybalvi  (weight gain, sedation) Status of problem: stable Interventions: -- Continue Abilify  5 mg daily (patient did not increase to 10 mg as previously instructed; will maintain at current dosing given akathisia)  # Akathisia Past medication trials: none Status of problem: stable Interventions: -- START propranolol  10-20 mg daily PRN akathisia  -- Risks, benefits, and side effects including but not limited to hypotension, dizziness were reviewed with informed consent provided  # Weight gain on antipsychotic: Zyprexa  started Feb 2022; weight gain from 153 lbs  July 2022 -> 193 lbs Jan 2024 -> 177 lbs 12/20/22 -> 172 lbs (05/30/23) Past medication trials: none prior Status of problem: stable Interventions: -- Have deferred metformin  for time being given prior noncompliance and transition to Abilify  -- Patient to continue lifestyle interventions including balanced diet and incorporating daily exercise  # Medication monitoring Interventions: -- Hgb A1c 5.7 (prediabetes); lipid profile 11/16/21 revealing for elevated LDL; patient in need of updated metabolic monitoring; will address at future appointment  # Vitamin D  deficiency Status of problem: acute Interventions: -- Vitamin D  9.7 05/10/23  -- Continue Vitamin D  50000 IU weekly (s5/20) with plan to transition to 2000 IU daily thereafter  # General health maintenance # Persistently elevated BP Status of problem: acute Interventions: -- Referral previously placed to internal medicine; still pending review  Patient was given contact information for behavioral health clinic and was instructed to call 911 for emergencies.   Subjective:  Chief Complaint:  Chief Complaint  Patient presents with   Medication Management    Interval History:   Patient reports things have been pretty good although notes primary issue of feeling physically anxious at night; hard to stay still. New to starting Abilify . Denies cognitive worries but body feels jittery. Getting about 6-7 hours at night because of this which has made her feel a bit grumpier. Denies persistent lows or feeling excessively elevated mood. Denies recent AVH; hyperreligiosity. Work has been going well; no recent issues.   Reports she has remained at 5 mg dose of Abilify  due to side effects.  Appetite has been better controlled since stopping Lybalvi ; no apparent weight loss.  Discussed options for management including treatment of akathisia vs. Switch to alternative medication. Amenable to initiation of propranolol ; denies hx of  asthma.  This Clinical research associate noted possible uncontrollable mouth movements; patient denies  awareness. Briefly introduced TD and discussed plan to further examine at next in person visit.  Visit Diagnosis:    ICD-10-CM   1. 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 Vitamin D  deficiency  E55.9     3. 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 Schizoaffective disorder (first psychotic episode in Feb 2022) Medication trials: olanzapine , trazodone  Hospitalizations: presented to ED 09/13/20 under IVC with [facility]; psychiatric ED March 2025 for psychosis and bizarre behaviors Substance use:   -- Etoh: socially  -- Tobacco: Black and Milds cigars x2 daily  -- Denies illicit drug use  Past Medical History:  Past Medical History:  Diagnosis Date   H/O candidiasis 02/22/2011   yeast   H/O rubella 02/22/2011   yeast   H/O varicella    Involuntary commitment    No pertinent past medical history    Schizophrenia (HCC)     Past Surgical History:  Procedure Laterality Date   CESAREAN SECTION  08/07/2011   Procedure: CESAREAN SECTION;  Surgeon: Rome Rigg, MD;  Location: [facility];  Service: Gynecology;  Laterality: N/A;  Primary cesarean section with delivery of baby girl at 23. Apgars 9/9.   stitches to r eye      Family Psychiatric History:  Uncle (maternal): Schizophrenia, diagnosed in his early 74s as well. He was able to live alone for some time, unfortunately was not very compliant and is currently living in a group home facility.   Family History:  Family History  Problem Relation Age of Onset   Drug abuse Father    Diabetes Maternal Uncle    Hypertension Maternal Uncle    Cancer Paternal Aunt    Cancer Maternal Grandmother    Heart disease Paternal Grandmother    Hypertension Paternal Grandmother    Diabetes Paternal Grandmother    Hypertension Maternal Grandfather    Anesthesia problems Neg Hx     Hypotension Neg Hx    Malignant hyperthermia Neg Hx    Pseudochol deficiency Neg Hx     Social History:  Social History   Socioeconomic History   Marital status: Single    Spouse name: Not on file   Number of children: Not on file   Years of education: Not on file   Highest education level: Not on file  Occupational History   Not on file  Tobacco Use   Smoking status: Every Day    Types: Cigars, Cigarettes   Smokeless tobacco: Never   Tobacco comments:    Smokes Black and Mild Cigars x2 daily  Substance and Sexual Activity   Alcohol use: No   Drug use: No   Sexual activity: Yes    Birth control/protection: None  Other Topics Concern   Not on file  Social History Narrative   Not on file   Social Drivers of Health   Financial Resource Strain: Not on file  Food Insecurity: Not on file  Transportation Needs: Not on file  Physical Activity: Not on file  Stress: Not on file  Social Connections: Not on file    Allergies: No Known Allergies  Current Medications: Current Outpatient Medications  Medication Sig Dispense Refill   propranolol  (INDERAL ) 10 MG tablet Take 1-2 tablets (10-20 mg total) by mouth daily as needed (restlessness). 60 tablet 1   ARIPiprazole  (ABILIFY ) 10 MG tablet Take 0.5 tablets (5 mg total) by mouth daily. 30 tablet 1  Vitamin D , Ergocalciferol , (DRISDOL ) 1.25 MG (50000 UNIT) CAPS capsule Take 1 capsule (50,000 Units total) by mouth every 7 (seven) days for 5 doses. Then start Vitamin D3 2000 units daily (obtained over the counter). 5 capsule 0   No current facility-administered medications for this visit.    ROS: Reports daytime sedation  Objective:  Psychiatric Specialty Exam: There were no vitals taken for this visit.There is no height or weight on file to calculate BMI.  General Appearance: Casual and Well Groomed  Eye Contact:  Good  Speech:  Clear and Coherent and Normal Rate  Volume:  Normal  Mood:  pretty good  Affect:   Appropriate and Euthymic  Thought Content: Denies AVH; IOR.  Denies paranoia, hyperreligiosity.   Suicidal Thoughts:  No  Homicidal Thoughts:  No  Thought Process:  Goal Directed and Linear  Orientation:  Full (Time, Place, and Person)    Memory:  Grossly intact  Judgment:  Fair  Insight:  Fair  Concentration:  Concentration: Good  Recall:  NA  Fund of Knowledge: Good  Language: Good  Psychomotor Activity:  Normal  Akathisia:  Yes  AIMS (if indicated): not done - concern for uncontrolled mouth movements on exam today; patient not aware. Plan to perform AIMS at next in person visit.  Assets:  Communication Skills Desire for Improvement Housing Leisure Time Physical Health Resilience Social Support Talents/Skills Transportation Vocational/Educational  ADL's:  Intact  Cognition: WNL  Sleep:  recently disrupted due to akathisia   PE: General: sits comfortably in view of camera; no acute distress  Pulm: no increased work of breathing on room air  MSK: all extremity movements appear intact  Neuro: no focal neurological deficits observed  Gait & Station: unable to assess by video    Metabolic Disorder Labs: Lab Results  Component Value Date   HGBA1C 5.7 (H) 11/16/2021   No results found for: PROLACTIN Lab Results  Component Value Date   CHOL 184 11/16/2021   TRIG 85 11/16/2021   HDL 44 11/16/2021   CHOLHDL 4.2 11/16/2021   LDLCALC 124 (H) 11/16/2021   Lab Results  Component Value Date   TSH 1.570 11/16/2021   Wt Readings from Last 3 Encounters:  05/30/23 172 lb 1.6 oz (78.1 kg)  05/02/23 174 lb 12.8 oz (79.3 kg)  03/22/23 173 lb 3.2 oz (78.6 kg)   Therapeutic Level Labs: No results found for: LITHIUM No results found for: VALPROATE No results found for: CBMZ  Screenings: GAD-7    Flowsheet Row Office Visit from 09/03/2021 in [facility] Office Visit from 07/02/2021 in [facility] Office  Visit from 04/30/2021 in [facility] Office Visit from 03/03/2021 in [facility] Clinical Support from 12/30/2020 in [facility]  Total GAD-7 Score 9 5 6 6 10    PHQ2-9    Flowsheet Row Office Visit from 09/03/2021 in [facility] Office Visit from 07/02/2021 in [facility] Office Visit from 04/30/2021 in [facility] Office Visit from 03/03/2021 in [facility] Clinical Support from 12/30/2020 in [facility]  PHQ-2 Total Score 5 2 2 2 2   PHQ-9 Total Score 13 4 8 6 10    Flowsheet Row UC from 03/06/2022 in [facility] Visit from 09/03/2021 in [facility] Office Visit from 07/02/2021 in [facility]  C-SSRS RISK CATEGORY No Risk No Risk No Risk    Collaboration of Care: Collaboration of Care: Medication Management AEB ongoing medication management and Psychiatrist AEB established with this provider  Patient/Guardian was advised Release of Information must be obtained prior to any record release in order to collaborate their care with an outside provider. Patient/Guardian was advised if they have not already done so to contact the registration department to sign all necessary forms in order for us  to release information regarding their care.   Consent: Patient/Guardian gives verbal consent for treatment and assignment of benefits for services provided during this visit. Patient/Guardian expressed understanding and agreed to proceed.   Virtual Visit via Video Note  I connected with Stephanie Golden on 07/27/23 at  2:30 PM EDT by a video enabled telemedicine application and verified that I am speaking with the correct person using two identifiers.  Location: Patient: private location at  workplace Provider: remote office in [place]   I discussed the limitations of evaluation and management by telemedicine and the availability of in person appointments. The patient expressed understanding and agreed to proceed.   I discussed the assessment and treatment plan with the patient. The patient was provided an opportunity to ask questions and all were answered. The patient agreed with the plan and demonstrated an understanding of the instructions.   The patient was advised to call back or seek an in-person evaluation if the symptoms worsen or if the condition fails to improve as anticipated.  I provided 25 minutes dedicated to the care of this patient via video on the date of this encounter to include chart review, face-to-face time with the patient, medication management/counseling.   [facility] A Jery Hollern 07/27/23

## 2023-07-27 ENCOUNTER — Telehealth (HOSPITAL_COMMUNITY): Admitting: Psychiatry

## 2023-07-27 ENCOUNTER — Encounter (HOSPITAL_COMMUNITY): Payer: Self-pay | Admitting: Psychiatry

## 2023-07-27 ENCOUNTER — Other Ambulatory Visit: Payer: Self-pay

## 2023-07-27 DIAGNOSIS — E559 Vitamin D deficiency, unspecified: Secondary | ICD-10-CM

## 2023-07-27 DIAGNOSIS — T43505A Adverse effect of unspecified antipsychotics and neuroleptics, initial encounter: Secondary | ICD-10-CM | POA: Diagnosis not present

## 2023-07-27 DIAGNOSIS — F203 Undifferentiated schizophrenia: Secondary | ICD-10-CM | POA: Diagnosis not present

## 2023-07-27 DIAGNOSIS — G2571 Drug induced akathisia: Secondary | ICD-10-CM | POA: Diagnosis not present

## 2023-07-27 MED ORDER — PROPRANOLOL HCL 10 MG PO TABS
10.0000 mg | ORAL_TABLET | Freq: Every day | ORAL | 1 refills | Status: DC | PRN
  Filled 2023-07-27: qty 60, 30d supply, fill #0

## 2023-07-27 MED ORDER — ARIPIPRAZOLE 10 MG PO TABS
5.0000 mg | ORAL_TABLET | Freq: Every day | ORAL | 1 refills | Status: DC
  Filled 2023-07-27: qty 30, 60d supply, fill #0
  Filled 2023-10-31: qty 30, 60d supply, fill #1

## 2023-07-27 MED ORDER — VITAMIN D (ERGOCALCIFEROL) 1.25 MG (50000 UNIT) PO CAPS
50000.0000 [IU] | ORAL_CAPSULE | ORAL | 0 refills | Status: AC
  Filled 2023-07-27: qty 5, 35d supply, fill #0

## 2023-07-27 NOTE — Patient Instructions (Signed)
 Thank you for attending your appointment today.  -- Continue Abilify  5 mg daily -- START propranolol  10-20 mg daily as needed for restlessness -- RESTART Vitamin D  50000 units x5 weeks -- Continue other medications as prescribed.  Please do not make any changes to medications without first discussing with your provider. If you are experiencing a psychiatric emergency, please call 911 or present to your nearest emergency department. Additional crisis, medication management, and therapy resources are included below.  [facility]  [address] [place], [place] [address] [phone] WALK-IN URGENT CARE 24/7 FOR ANYONE [address], [place], [place]  [phone] Fax: [phone] [email or web address] *Interpreters available *Accepts all insurance and uninsured for Urgent Care needs *Accepts Medicaid and uninsured for outpatient treatment (below)      ONLY FOR [facility]  Below:    Outpatient New Patient Assessment/Therapy Walk-ins:        Monday, Wednesday, and Thursday 8am until slots are full (first come, first served)                   New Patient Psychiatry/Medication Management        Monday-Friday 8am-11am (first come, first served)               For all walk-ins we ask that you arrive by 7:15am, because patients will be seen in the order of arrival.

## 2023-09-14 NOTE — Progress Notes (Unsigned)
 Patient did not connect for virtual psychiatric medication management appointment on 09/15/23 at 11:30AM. Sent secure video link with no response. Called phone with no answer; left VM with callback number to reschedule.  LAURAINE DELENA PUMMEL, MD 09/15/23

## 2023-09-15 ENCOUNTER — Encounter (HOSPITAL_COMMUNITY): Payer: Self-pay

## 2023-09-15 ENCOUNTER — Encounter (HOSPITAL_COMMUNITY): Admitting: Psychiatry

## 2023-10-20 ENCOUNTER — Emergency Department
Admission: EM | Admit: 2023-10-20 | Discharge: 2023-10-22 | Disposition: A | Discharge status: Other Acute Inpatient Hospital | Payer: Self-pay | Attending: Emergency Medicine | Admitting: Emergency Medicine

## 2023-10-20 ENCOUNTER — Emergency Department: Payer: Self-pay

## 2023-10-20 DIAGNOSIS — F209 Schizophrenia, unspecified: Secondary | ICD-10-CM

## 2023-10-20 DIAGNOSIS — F23 Brief psychotic disorder: Secondary | ICD-10-CM

## 2023-10-20 LAB — CBC WITH DIFFERENTIAL/PLATELET
Abs Immature Granulocytes: 0.03 K/uL (ref 0.00–0.07)
Basophils Absolute: 0 K/uL (ref 0.0–0.1)
Basophils Relative: 0 %
Eosinophils Absolute: 0 K/uL (ref 0.0–0.5)
Eosinophils Relative: 0 %
HCT: 42.6 % (ref 36.0–46.0)
Hemoglobin: 14.5 g/dL (ref 12.0–15.0)
Immature Granulocytes: 0 %
Lymphocytes Relative: 13 %
Lymphs Abs: 1 K/uL (ref 0.7–4.0)
MCH: 29.6 pg (ref 26.0–34.0)
MCHC: 34 g/dL (ref 30.0–36.0)
MCV: 86.9 fL (ref 80.0–100.0)
Monocytes Absolute: 0.4 K/uL (ref 0.1–1.0)
Monocytes Relative: 4 %
Neutro Abs: 6.7 K/uL (ref 1.7–7.7)
Neutrophils Relative %: 83 %
Platelets: 148 K/uL — ABNORMAL LOW (ref 150–400)
RBC: 4.9 MIL/uL (ref 3.87–5.11)
RDW: 13.2 % (ref 11.5–15.5)
WBC: 8.2 K/uL (ref 4.0–10.5)
nRBC: 0 % (ref 0.0–0.2)

## 2023-10-20 LAB — URINE DRUG SCREEN, QUALITATIVE (ARMC ONLY)
Amphetamines, Ur Screen: NOT DETECTED
Barbiturates, Ur Screen: NOT DETECTED
Benzodiazepine, Ur Scrn: POSITIVE — AB
Cannabinoid 50 Ng, Ur ~~LOC~~: NOT DETECTED
Cocaine Metabolite,Ur ~~LOC~~: NOT DETECTED
MDMA (Ecstasy)Ur Screen: NOT DETECTED
Methadone Scn, Ur: NOT DETECTED
Opiate, Ur Screen: NOT DETECTED
Phencyclidine (PCP) Ur S: NOT DETECTED
Tricyclic, Ur Screen: NOT DETECTED

## 2023-10-20 LAB — MAGNESIUM: Magnesium: 2.3 mg/dL (ref 1.7–2.4)

## 2023-10-20 LAB — COMPREHENSIVE METABOLIC PANEL WITH GFR
ALT: 17 U/L (ref 0–44)
AST: 22 U/L (ref 15–41)
Albumin: 4.4 g/dL (ref 3.5–5.0)
Alkaline Phosphatase: 55 U/L (ref 38–126)
Anion gap: 13 (ref 5–15)
BUN: 9 mg/dL (ref 6–20)
CO2: 24 mmol/L (ref 22–32)
Calcium: 9.4 mg/dL (ref 8.9–10.3)
Chloride: 105 mmol/L (ref 98–111)
Creatinine, Ser: 0.93 mg/dL (ref 0.44–1.00)
GFR, Estimated: >60 mL/min (ref >60)
Glucose, Bld: 107 mg/dL — ABNORMAL HIGH (ref 70–99)
Potassium: 3.6 mmol/L (ref 3.5–5.1)
Sodium: 142 mmol/L (ref 135–145)
Total Bilirubin: 0.8 mg/dL (ref 0.0–1.2)
Total Protein: 7.9 g/dL (ref 6.5–8.1)

## 2023-10-20 LAB — TSH: TSH: 1.873 u[IU]/mL (ref 0.350–4.500)

## 2023-10-20 LAB — ETHANOL: Alcohol, Ethyl (B): <15 mg/dL (ref <15)

## 2023-10-20 MED ORDER — DIAZEPAM 5 MG/ML IJ SOLN
10.0000 mg | Freq: Once | INTRAMUSCULAR | Status: AC
  Administered 2023-10-20: 10 mg via INTRAMUSCULAR
  Filled 2023-10-20: qty 2

## 2023-10-20 MED ORDER — HALOPERIDOL LACTATE 5 MG/ML IJ SOLN
5.0000 mg | Freq: Four times a day (QID) | INTRAMUSCULAR | Status: DC | PRN
  Administered 2023-10-20: 5 mg via INTRAMUSCULAR
  Filled 2023-10-20 (×2): qty 1

## 2023-10-20 MED ORDER — OLANZAPINE 10 MG PO TABS
10.0000 mg | ORAL_TABLET | Freq: Two times a day (BID) | ORAL | Status: DC
  Administered 2023-10-21: 10 mg via ORAL
  Filled 2023-10-20 (×3): qty 1

## 2023-10-20 MED ORDER — MIDAZOLAM HCL (PF) 10 MG/2ML IJ SOLN
INTRAMUSCULAR | Status: AC
  Administered 2023-10-20: 5 mg
  Filled 2023-10-20: qty 2

## 2023-10-20 MED ORDER — HALOPERIDOL LACTATE 5 MG/ML IJ SOLN
5.0000 mg | Freq: Once | INTRAMUSCULAR | Status: AC
  Administered 2023-10-20: 5 mg via INTRAMUSCULAR
  Filled 2023-10-20: qty 1

## 2023-10-20 MED ORDER — LORAZEPAM 2 MG/ML IJ SOLN
2.0000 mg | Freq: Once | INTRAMUSCULAR | Status: AC
Start: 2023-10-20 — End: 2023-10-20
  Administered 2023-10-20: 2 mg via INTRAMUSCULAR
  Filled 2023-10-20: qty 1

## 2023-10-20 MED ORDER — DIPHENHYDRAMINE HCL 50 MG/ML IJ SOLN: 50.0000 mg | Freq: Once | INTRAMUSCULAR | Status: AC

## 2023-10-20 MED ORDER — DIPHENHYDRAMINE HCL 50 MG/ML IJ SOLN
50.0000 mg | Freq: Once | INTRAMUSCULAR | Status: AC
Start: 2023-10-20 — End: 2023-10-20
  Administered 2023-10-20: 50 mg via INTRAMUSCULAR
  Filled 2023-10-20: qty 1

## 2023-10-20 MED ORDER — DIPHENHYDRAMINE HCL 50 MG/ML IJ SOLN
INTRAMUSCULAR | Status: AC
  Administered 2023-10-20: 50 mg via INTRAMUSCULAR
  Filled 2023-10-20: qty 1

## 2023-10-20 MED ORDER — MIDAZOLAM HCL 5 MG/5ML IJ SOLN: 5.0000 mg | Freq: Once | INTRAMUSCULAR | Status: DC

## 2023-10-20 MED ORDER — LORAZEPAM 2 MG/ML IJ SOLN: 2.0000 mg | Freq: Four times a day (QID) | INTRAMUSCULAR | Status: DC | PRN

## 2023-10-20 MED ORDER — CHLORPROMAZINE HCL 25 MG/ML IJ SOLN
50.0000 mg | Freq: Three times a day (TID) | INTRAMUSCULAR | Status: DC | PRN
  Administered 2023-10-20: 50 mg via INTRAMUSCULAR
  Filled 2023-10-20 (×2): qty 2

## 2023-10-20 MED ORDER — HALOPERIDOL LACTATE 5 MG/ML IJ SOLN
5.0000 mg | Freq: Once | INTRAMUSCULAR | Status: AC
  Administered 2023-10-20: 5 mg via INTRAMUSCULAR

## 2023-10-20 MED ORDER — MIDAZOLAM HCL 2 MG/2ML IJ SOLN
2.0000 mg | Freq: Once | INTRAMUSCULAR | Status: DC | PRN
  Filled 2023-10-20: qty 2

## 2023-10-20 MED ORDER — ZIPRASIDONE MESYLATE 20 MG IM SOLR
20.0000 mg | Freq: Once | INTRAMUSCULAR | Status: AC
Start: 2023-10-20 — End: 2023-10-20
  Administered 2023-10-20: 20 mg via INTRAMUSCULAR
  Filled 2023-10-20: qty 20

## 2023-10-20 NOTE — ED Provider Notes (Addendum)
 Emergency Medicine Observation Re-evaluation Note  Stephanie Golden is a 38 y.o. female, seen on rounds today.  Pt initially presented to the ED for complaints of Psychiatric Evaluation Currently, the patient is in bed awake  Physical Exam  BP (!) 141/99 (BP Location: Left Arm)   Pulse (!) 111   Temp 99 F (37.2 C) (Oral)   Resp 16   Ht 5' 3 (1.6 m)   Wt 78 kg   SpO2 95%   BMI 30.46 kg/m  Physical Exam General: No acute distress Cardiac: Warm and well-perfused Lungs:  no distress   ED Course / MDM  EKG:   I have reviewed the labs performed to date as well as medications administered while in observation.  Recent changes in the last 24 hours include seen psychiatry agree with IVC they have implemented Thorazine Clinical Course as of 10/20/23 2327  Fri Oct 20, 2023  8177 Patient with increasing agitation trying to get out of bed kicking staff.  Attempted verbal de-escalation and patient was not amenable.  To keep patient and staff safe administered Benadryl  Haldol  and Ativan .  I reached back out to psychiatry for further recommendations and they did recommend possibly initiating olanzapine  10 mg twice a day which I have ordered.  Patient currently getting the CT scan [HD]  1828 CT Head Wo Contrast CT head with no acute abnormality [HD]  2210 Notified that patient had a fall from bed and hit her head on the wall as well.  Will order repeat CT head and patient placed on fall precautions [HD]  2327 CT Head Wo Contrast Repeat CT head unremarkable [HD]    Clinical Course User Index [HD] Nicholaus Rolland BRAVO, MD     Plan  Current plan is for inpatient psych    Nicholaus Rolland BRAVO, MD 10/20/23 1540  .Critical Care  Performed by: Nicholaus Rolland BRAVO, MD Authorized by: Nicholaus Rolland BRAVO, MD   Critical care provider statement:    Critical care time (minutes):  32   Critical care was time spent personally by me on the following activities:  Development of treatment plan with patient or  surrogate, discussions with consultants, evaluation of patient's response to treatment, examination of patient, ordering and review of laboratory studies, ordering and review of radiographic studies, ordering and performing treatments and interventions, pulse oximetry, re-evaluation of patient's condition and review of old charts (Acute agitation) Comments:     Acute agitation requiring intramuscular medication that was not amenable to verbal de-escalation     Nicholaus Rolland BRAVO, MD 10/20/23 2248

## 2023-10-20 NOTE — ED Notes (Signed)
 Pregnancy test was (-) negative and notified nurse [place]

## 2023-10-20 NOTE — Progress Notes (Signed)
   10/20/23 1000  Spiritual Encounters  Type of Visit Initial  Care provided to: Patient  Conversation partners present during encounter Nurse  Referral source Other (comment) (Security)  Reason for visit Routine spiritual support  OnCall Visit No   Chaplain visited with patient per a referral from Security that patient was crying.  Chaplain went to patient to offer a compassionate presence; however, Chaplain's presence seemed to agitate the patient further.  Chaplain deferred to Clinical staff who shared it may not be a good time for spiritual care because medications had not taken effect at that point.  Rev. Rana M. Nicholaus, M.Div. Chaplain Resident [facility]

## 2023-10-20 NOTE — ED Notes (Signed)
 Pt remains trying to get up, speaking to self and is uncooperative. Provider made aware.

## 2023-10-20 NOTE — ED Notes (Signed)
 Patient sitting on side of bed

## 2023-10-20 NOTE — BH Assessment (Signed)
 Per [facility] (Antoinette C.), patient to be referred out of system.  Referral information for Psychiatric Hospitalization faxed to;   Service Provider Phone  [facility]  [phone]  [facility]  [phone]  [facility]  [phone]  [facility]  [phone]  [facility]  [phone]  [facility]  [phone]  [facility]  [phone]  [facility]  [phone]  [facility]  [phone]  [facility]  [phone]  [facility]  [phone]  [place] EFAX  [phone]  [facility]  [phone]  [facility]  [phone]  [facility]  [phone]

## 2023-10-20 NOTE — Consult Note (Signed)
 [facility] Psychiatric Consult Initial  Patient Name: .Stephanie Golden  MRN: [id number]  DOB: 15-Jan-1985  Consult Order details:  Orders (From admission, onward)     Start     Ordered   10/20/23 0846  CONSULT TO CALL ACT TEAM       Ordering Provider: Levander Slate, MD  Provider:  (Not yet assigned)  Question:  Reason for Consult?  Answer:  Psych consult   10/20/23 0845   10/20/23 0846  IP CONSULT TO PSYCHIATRY       Ordering Provider: Levander Slate, MD  Provider:  (Not yet assigned)  Question:  Reason for consult:  Answer:  Medication management   10/20/23 0845             Mode of Visit: In person    Psychiatry Consult Evaluation  Service Date: October 20, 2023 LOS:  LOS: 0 days  Chief Complaint psychosis  Primary Psychiatric Diagnoses  Schizophrenia   Assessment  Stephanie Golden is a 38 y.o. female admitted: Presented to the ED for disorganized and bizarre behavior.  Per collateral patient lives at home with a daughter who reached out to the patient's mother due to patient's concerning behavior.  Patient has a history of schizophrenia hospitalization in the ED at [facility] in March of this year.  They were last seen by their outpatient psychiatrist in July per the available records.  On exam they were disorganized they were anxious speech was elevated they were minimally participate in the exam.  They required frequent redirection but were not easily redirectable.  They have required multiple PRNs for agitation.  Patient will require inpatient psychiatric hospitalization for medication management and stabilization due to severely impaired judgment and danger to themself and others.    Diagnoses:  Active Hospital problems: Active Problems:   * No active hospital problems. *    Plan   ## Psychiatric Medication Recommendations:  Thorazine 50 mg 3 times daily as needed for agitation and psychosis ## Medical Decision Making Capacity: Not specifically addressed in this  encounter  ## Further Work-up:   -- most recent EKG on 09/15/20 had QtC of 424,  -- Pertinent labwork reviewed, magnesium  and TSH added; monitor serum potassium and magnesium  levels during treatment, as electrolyte abnormalities may increase risk of QTc prolongation and cardiac arrhythmia.  ## Disposition:--Patient will require inpatient psychiatric hospitalization for medication management and stabilization when medically cleared.  Patient should be placed under IVC.  ## Behavioral / Environmental: - No specific recommendations at this time.     ## Safety and Observation Level:  - Based on my clinical evaluation, I estimate the patient to be at low risk of self harm in the current setting. - At this time, we recommend  routine. This decision is based on my review of the chart including patient's history and current presentation, interview of the patient, mental status examination, and consideration of suicide risk including evaluating suicidal ideation, plan, intent, suicidal or self-harm behaviors, risk factors, and protective factors. This judgment is based on our ability to directly address suicide risk, implement suicide prevention strategies, and develop a safety plan while the patient is in the clinical setting. Please contact our team if there is a concern that risk level has changed.  CSSR Risk Category:C-SSRS RISK CATEGORY: No Risk  Thank you for this consult request. Recommendations have been communicated to the primary team.  We will refer for inpatient psychiatric hospitalization at this time.   Stephanie Golden Right, PA-C  History of Present Illness  Relevant Aspects of Hospital ED   Patient Report:  On exam patient was found in the hallway they were tearful and disorganized they were observed to be responding to internal stimuli.  Did not meaningfully participate in the exam.  They required frequent redirection and have required multiple PRNs.  On chart review they have a  history of schizophrenia with most recent admission in March of this year.  Her last documented outpatient follow-up visit on July 17 where they were started on propranolol  and continued on Abilify .  They were on Abilify  5 mg with some reported akathisia.  She has previous medication trials of the lybalvi  and Zyprexa .  Collateral obtained by SW: Jadelyn L. Trager is a 38 year old female who presents to the ER because she stopped taking his medications and currently psychotic. Patient lives in the home with her daughter. The daughter contacted the grandmother, patients' mother, and informed her of her behaviors. The patient was responding to internal stimuli, manic and wasn't sleeping. Patient mother states the brother, patient's uncle, deals with the same mental illness and that's how she has learned to work with the daughter/patient. It's reported the patient stop taking her medications in June of this year. During the interview, the patient was tearful and afraid. She was stating people were after her and trying to harm her.       Psychiatric and Social History  Psychiatric History:  Information collected from chart review/pt unable to provide history given psychosis  Diagnoses: schizophrenia vs. Schizoaffective disorder (first psychotic episode in Feb 2022) Medication trials: olanzapine , trazodone  Hospitalizations: presented to ED 09/13/20 under IVC with [facility]; psychiatric ED March 2025 for psychosis and bizarre behaviors Substance use:              -- Etoh: socially             -- Tobacco: Black and Milds cigars x2 daily             -- Denies illicit drug use  Exam Findings  Physical Exam: defer to ed provider Vital Signs:  Temp:  [98 F (36.7 C)-98.2 F (36.8 C)] 98.2 F (36.8 C) (10/10 0927) Pulse Rate:  [93] 93 (10/10 0927) Resp:  [18] 18 (10/10 0927) BP: (141-142)/(72-98) 141/98 (10/10 0927) SpO2:  [98 %-100 %] 98 % (10/10 0927) Weight:  [78 kg] 78 kg (10/10 0841) Blood pressure (!)  141/98, pulse 93, temperature 98.2 F (36.8 C), temperature source Oral, resp. rate 18, height 5' 3 (1.6 m), weight 78 kg, SpO2 98%. Body mass index is 30.46 kg/m.    Mental Status Exam: General Appearance: Disheveled  Orientation:  Negative  Memory:  Immediate;   Poor  Concentration:  Concentration: Poor  Recall:  Poor  Attention  Poor  Eye Contact:  Fair  Speech:  Pressured  Language:  Fair  Volume:  Increased  Mood: anxious  Affect:  Congruent  Thought Process:  Disorganized  Thought Content:  Illogical  Suicidal Thoughts:  UTA  Homicidal Thoughts:  UTA  Judgement:  Impaired  Insight:  Lacking  Psychomotor Activity:  Restlessness  Akathisia:  [employer] of Knowledge:  Poor      Assets:  Housing Social Support  Cognition:  uta  ADL's:  Impaired  AIMS (if indicated):        Other History   These have been pulled in through the EMR, reviewed, and updated if appropriate.  Family History:  The patient's family history includes Cancer  in her maternal grandmother and paternal aunt; Diabetes in her maternal uncle and paternal grandmother; Drug abuse in her father; Heart disease in her paternal grandmother; Hypertension in her maternal grandfather, maternal uncle, and paternal grandmother.  Medical History: Past Medical History:  Diagnosis Date  . H/O candidiasis 02/22/2011   yeast  . H/O rubella 02/22/2011   yeast  . H/O varicella   . Involuntary commitment   . No pertinent past medical history   . Schizophrenia [facility])     Surgical History: Past Surgical History:  Procedure Laterality Date  . CESAREAN SECTION  08/07/2011   Procedure: CESAREAN SECTION;  Surgeon: Rome Rigg, MD;  Location: [facility];  Service: Gynecology;  Laterality: N/A;  Primary cesarean section with delivery of baby girl at 68. Apgars 9/9.  . stitches to r eye       Medications:   Current Facility-Administered Medications:  .  chlorproMAZINE (THORAZINE) injection 50 mg, 50 mg,  Intramuscular, TID PRN, Kenyata Napier E, PA-C .  midazolam (VERSED) 5 MG/5ML injection 5 mg, 5 mg, Intramuscular, Once, Ray, Neha, MD  Current Outpatient Medications:  .  ARIPiprazole  (ABILIFY ) 10 MG tablet, Take 0.5 tablets (5 mg total) by mouth daily., Disp: 30 tablet, Rfl: 1 .  propranolol  (INDERAL ) 10 MG tablet, Take 1-2 tablets (10-20 mg total) by mouth daily as needed (restlessness)., Disp: 60 tablet, Rfl: 1  Allergies: No Known Allergies  I have reviewed this case with Dr. Jadapalle who is agreeable with this plan.  Stephanie Golden Right, PA-C

## 2023-10-20 NOTE — ED Notes (Signed)
 Pt becoming more agitated at this time. Pt uncooperative and unable to follow commands. Pt is tearful, screaming, and attempting to get out of bed. Provider made aware.

## 2023-10-20 NOTE — ED Triage Notes (Addendum)
 Pt BIB [facility] from home. Pt hx of schizophrenia and has been noncompliant with medications since June. Per EMS pt woke up at [facility] and has been in the current disorganized behavior since. Pt screaming, mumbling words, uncooperative, and agitated. Pt unable to answer any questions at this time.   Past Medical History:  Diagnosis Date   H/O candidiasis 02/22/2011   yeast   H/O rubella 02/22/2011   yeast   H/O varicella    Involuntary commitment    No pertinent past medical history    Schizophrenia (HCC)

## 2023-10-20 NOTE — ED Notes (Signed)
 RN attempted to call CT at this time.

## 2023-10-20 NOTE — ED Notes (Signed)
 Returned from CT. Pt back in bed.

## 2023-10-20 NOTE — ED Notes (Signed)
 Dinner tray provided to pt

## 2023-10-20 NOTE — ED Notes (Signed)
 Pt remains uncooperative at this time. Provider made aware.

## 2023-10-20 NOTE — ED Notes (Signed)
 VOL

## 2023-10-20 NOTE — ED Notes (Signed)
 Pt attempting to get out of bed after multiple attempts to educate pt on staying in bed. Pt persist and is unable to follow instruction at this time. Pt guided back to bed and is laying in bed speaking to self at this time.

## 2023-10-20 NOTE — ED Provider Notes (Addendum)
 [facility] Provider Note    Event Date/Time   First MD Initiated Contact with Patient 10/20/23 0830     (approximate)   History   Psychiatric Evaluation   HPI  Stephanie Golden is a 38 year old female with history of schizophrenia on outpatient Abilify  presenting to the emergency department for evaluation of altered mental status.  Per report, patient's family member called EMS as the patient was mumbling, screaming, uncooperative.  Family reported she has been noncompliant with her medications since June.  On presentation here, patient ripping bedsheet, screaming at staff.  Unable to be verbally redirected.       Physical Exam   Triage Vital Signs: ED Triage Vitals  Encounter Vitals Group     BP 10/20/23 0837 (!) 142/72     Girls Systolic BP Percentile --      Girls Diastolic BP Percentile --      Boys Systolic BP Percentile --      Boys Diastolic BP Percentile --      Pulse --      Resp 10/20/23 0837 18     Temp 10/20/23 0837 98 F (36.7 C)     Temp src --      SpO2 10/20/23 0837 100 %     Weight 10/20/23 0841 171 lb 15.3 oz (78 kg)     Height 10/20/23 0841 5' 3 (1.6 m)     Head Circumference --      Peak Flow --      Pain Score 10/20/23 0841 0     Pain Loc --      Pain Education --      Exclude from Growth Chart --     Most recent vital signs: Vitals:   10/20/23 0837 10/20/23 0927  BP: (!) 142/72 (!) 141/98  Pulse:  93  Resp: 18 18  Temp: 98 F (36.7 C) 98.2 F (36.8 C)  SpO2: 100% 98%     General: Awake, interactive  CV:  Good peripheral perfusion Resp:  Unlabored respirations Abd:  Nondistended.  Neuro:  Symmetric facial movement, fluid speech   ED Results / Procedures / Treatments   Labs (all labs ordered are listed, but only abnormal results are displayed) Labs Reviewed  CBC WITH DIFFERENTIAL/PLATELET - Abnormal; Notable for the following components:      Result Value   Platelets 148 (*)    All other  components within normal limits  COMPREHENSIVE METABOLIC PANEL WITH GFR - Abnormal; Notable for the following components:   Glucose, Bld 107 (*)    All other components within normal limits  URINE DRUG SCREEN, QUALITATIVE ([facility] ONLY) - Abnormal; Notable for the following components:   Benzodiazepine, Ur Scrn POSITIVE (*)    All other components within normal limits  ETHANOL  POC URINE PREG, ED     EKG EKG independently reviewed and interpreted by myself demonstrates:    RADIOLOGY Imaging independently reviewed and interpreted by myself demonstrates:   Formal Radiology Read:  No results found.  PROCEDURES:  Critical Care performed: No  Procedures   MEDICATIONS ORDERED IN ED: Medications  midazolam (VERSED) 5 MG/5ML injection 5 mg (5 mg Intramuscular Not Given 10/20/23 0843)  haloperidol  lactate (HALDOL ) injection 5 mg (5 mg Intramuscular Given 10/20/23 0951)  midazolam PF (VERSED) 10 MG/2ML injection (5 mg  Given 10/20/23 0843)     IMPRESSION / MDM / ASSESSMENT AND PLAN / ED COURSE  I reviewed the triage vital signs and the nursing  notes.  Differential diagnosis includes, but is not limited to, primary psychiatric disorder, substance-induced mood disorder, acute stress response  Patient's presentation is most consistent with acute presentation with potential threat to life or bodily function.  38 year old female presenting with altered mental status concerning for decompensated psychiatric illness.  Patient agitated, unable to be verbally redirected on presentation.  Versed ordered for anxiolysis.  Psychiatry and [name] consulted.  Received update from psychiatry team.  They have evaluated the patient, plan for inpatient placement.  Labs without critical derangements.  1500 Patient was agitated initially after receiving Versed, but had recurrent episodes where she would try to get out of bed, not redirectable.  She received additional medications from psychiatry team  including Haldol , Benadryl .  Did continue to try and attempt to get out of bed and NP did not ask if I could order additional medications to assist with this.  She was given Valium with limited benefit.  EKG was obtained demonstrating QTc of 465.  IM Geodon  ordered.  Behavioral health team did also request CT head be performed after obtaining history from family that patient had a acute change in her mental status over the past 24 hours.  This has been ordered. Signed out to oncoming physician pending disposition.       FINAL CLINICAL IMPRESSION(S) / ED DIAGNOSES   Final diagnoses:  Acute psychosis (HCC)     Rx / DC Orders   ED Discharge Orders     None        Note:  This document was prepared using Dragon voice recognition software and may include unintentional dictation errors.   Levander Slate, MD 10/20/23 1021    Levander Slate, MD 10/20/23 [phone]
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
## 2023-10-20 NOTE — ED Notes (Signed)
 Pt uncooperative, kicking and screaming at this time.

## 2023-10-20 NOTE — ED Notes (Addendum)
 Accompanied the pt to CT scan with the NT and security.

## 2023-10-20 NOTE — ED Notes (Signed)
 VOl per PA [place] recommends inpt psych

## 2023-10-20 NOTE — ED Notes (Signed)
 Pt given snack.

## 2023-10-21 LAB — POC URINE PREG, ED: Preg Test, Ur: NEGATIVE

## 2023-10-21 LAB — RESP PANEL BY RT-PCR (RSV, FLU A&B, COVID)  RVPGX2
Influenza A by PCR: NEGATIVE
Influenza B by PCR: NEGATIVE
Resp Syncytial Virus by PCR: NEGATIVE
SARS Coronavirus 2 by RT PCR: NEGATIVE

## 2023-10-21 MED ORDER — NICOTINE 21 MG/24HR TD PT24
21.0000 mg | MEDICATED_PATCH | Freq: Every day | TRANSDERMAL | Status: DC
  Administered 2023-10-21: 21 mg via TRANSDERMAL
  Filled 2023-10-21: qty 1

## 2023-10-21 NOTE — Progress Notes (Signed)
 [facility]/BM[facility] LCSW Progress Note   10/21/2023    5:11 PM  CARTER KAMAN   [id number]   Type of Contact and Topic:  Psychiatric Bed Placement   Pt accepted to [facility]    Patient meets inpatient criteria per   The attending provider will be Dr. Debby Carrion  Call report to [phone]  Arthea Cleaves, RN @ [facility] ED notified.     Pt scheduled  to arrive at [facility] TOMORROW AT 12 Noon.    Jaythan Hinely, MSW, LCSW-A  5:13 PM 10/21/2023

## 2023-10-21 NOTE — Progress Notes (Signed)
 Patient has been denied by [facility] due to acuity. Patient has been faxed out to the following facilities:   [facility]  [place] [address] [phone]  [facility]  [address] [place] [address] [phone]  [facility] [address]  [address] [place] [address] [phone]  [facility]  [address] [place], [place] [address] [phone]  [facility]  [address] [place] [address] [phone]  [facility]  [address] [place], [place] [address] [phone]  [facility]  [address], [place] [address] [phone]  [facility]  [address] [place] [address] [phone]  [facility]  [address], [place] [address] [phone]  [facility]  [address], [place] [address] [phone]  [facility]  [place] [phone]  [facility] EFAX  [address] [place], [place] [phone]  [facility]  [address] [place] [address] [phone]  [facility]  [address], [place] [address] [phone]  [facility]  [address] [place] [address] [phone]  [facility]  [address] [place] [address] [phone]  [facility]  [address] [place], [place] [address] [phone]  [facility] Patient Placement  [facility], [place] [phone]  [facility]  [address] [place] [address] [phone]  [facility]  [address] [place], [place] [address] [phone]  [facility]  [address] [place] [address] [phone]  [facility]  [address], [place] [address] [phone]   Bunnie Gallop, MSW, LCSW-A  1:40 PM 10/21/2023

## 2023-10-21 NOTE — ED Notes (Signed)
 Pt wanting to take a shower. Materials provided.

## 2023-10-21 NOTE — ED Notes (Signed)
 Pt informed of transfer to [facility] for tomorrow, also informed of IVC as pt was unaware. Pt upset at this news, was expecting to go home, but was able to calm self. Pt worrying over having bills to pay, this RN asked pt if there was anyone who could help make sure payment goes through, pt stating she could possibly ask her mother. Pt calm and cooperative at this time, will continue to monitor.

## 2023-10-21 NOTE — Progress Notes (Signed)
 CSW sent COVID results to [facility] for continued review. CSW will continue to monitor the patient to secure recommended disposition.    Bunnie Gallop, MSW, LCSW-A  4:27 PM 10/21/2023

## 2023-10-21 NOTE — ED Notes (Signed)
 Pt informing this RN she feels like she cannot live her normal day which includes smoking and using her phone. Pt informed we could get a nicotine  patch to help with smoking and has three phone hour blocks if she would like to use phone. Pt also telling this RN a story from 4 years ago about family dynamics and that mother is dating a white guy and he and his family are racist. Pt stating not wanting to stay here for long period of time, informed pt the process does take some time but we are actively looking for placement. Pt agreed to try nicotine  patch, MD Bradler called for orders.

## 2023-10-21 NOTE — ED Notes (Signed)

## 2023-10-21 NOTE — ED Notes (Signed)
 Dinner tray provided to pt

## 2023-10-21 NOTE — ED Notes (Signed)
 Pt given number for [employer], significant other, and phone to call.

## 2023-10-21 NOTE — ED Provider Notes (Signed)
 Emergency Medicine Observation Re-evaluation Note  Stephanie Golden is a 38 y.o. female, seen on rounds today.  Pt initially presented to the ED for complaints of Psychiatric Evaluation  Currently, the patient is is no acute distress. Denies any concerns at this time.  Physical Exam  Blood pressure (!) 141/99, pulse (!) 111, temperature 99 F (37.2 C), temperature source Oral, resp. rate 16, height 5' 3 (1.6 m), weight 78 kg, SpO2 95%.  Physical Exam: General: No apparent distress Pulm: Normal WOB Neuro: Moving all extremities Psych: Resting comfortably     ED Course / MDM   Clinical Course as of 10/21/23 0634  Fri Oct 20, 2023  1822 Patient with increasing agitation trying to get out of bed kicking staff.  Attempted verbal de-escalation and patient was not amenable.  To keep patient and staff safe administered Benadryl  Haldol  and Ativan .  I reached back out to psychiatry for further recommendations and they did recommend possibly initiating olanzapine  10 mg twice a day which I have ordered.  Patient currently getting the CT scan [HD]  1828 CT Head Wo Contrast CT head with no acute abnormality [HD]  2210 Notified that patient had a fall from bed and hit her head on the wall as well.  Will order repeat CT head and patient placed on fall precautions [HD]  2327 CT Head Wo Contrast Repeat CT head unremarkable [HD]    Clinical Course User Index [HD] Nicholaus Rolland BRAVO, MD    I have reviewed the labs performed to date as well as medications administered while in observation.  Recent changes in the last 24 hours include: No acute events overnight.  Plan   Current plan: Patient awaiting psychiatric disposition.  I have placed patient under IVC.  She is currently awaiting placement for inpatient psychiatric treatment.   Gaylin Bulthuis, Josette SAILOR, DO 10/21/23 [phone]

## 2023-10-21 NOTE — ED Notes (Signed)
 Pt got out of bed and walked to the wall and started to hit her head against the wall. Patient then slid down to the ground. Pt was helped back to bed and assessed.

## 2023-10-21 NOTE — ED Notes (Signed)
 Pt to be transported to [facility] on 10/22/2023

## 2023-10-21 NOTE — ED Notes (Signed)
 Pt mother calling to speak with pt. Pt given phone to call.

## 2023-10-22 NOTE — ED Notes (Signed)
 Pt accepted to [facility]    Patient meets inpatient criteria per  The attending provider will be Dr. Debby Carrion  Call report to [phone]  Arthea Cleaves, RN @ [facility] ED notified.      Pt scheduled  to arrive at [facility] 10/22/23 AT 12 Noon.

## 2023-10-22 NOTE — ED Provider Notes (Signed)
 Emergency Medicine Observation Re-evaluation Note  Stephanie Golden is a 38 y.o. female, seen on rounds today.  Pt initially presented to the ED for complaints of Psychiatric Evaluation  Currently, the patient is resting in bed. No reported issues from nursing team.   Physical Exam  BP (!) 158/105 (BP Location: Right Arm)   Pulse (!) 110   Temp (!) 97.5 F (36.4 C) (Oral)   Resp 18   Ht 5' 3 (1.6 m)   Wt 78 kg   SpO2 95%   BMI 30.46 kg/m  Physical Exam General: Resting in bed  ED Course / MDM   Negative viral swab, UPT over last 24 hours  Plan  Current plan is for dispo per psychiatry, current plan for inpatient placement.   Levander Slate, MD 10/22/23 [phone]

## 2023-10-22 NOTE — ED Notes (Signed)
 EMTALA Reviewed by this RN.

## 2023-10-22 NOTE — ED Notes (Signed)
Shift report received, assumed care of patient at this time 

## 2023-10-22 NOTE — ED Notes (Signed)
 Pt accepted to [facility]    Patient meets inpatient criteria per  The attending provider will be Dr. Debby Carrion  Call report to [phone]  Arthea Cleaves, RN @ [facility] ED notified.      Pt scheduled  to arrive at [facility] TOMORROW AT 12 Noon.

## 2023-10-22 NOTE — ED Notes (Signed)
 Pt provided with breakfast tray and apple juice.

## 2023-10-31 ENCOUNTER — Other Ambulatory Visit: Payer: Self-pay

## 2023-11-06 NOTE — Progress Notes (Signed)
 [facility] MD Outpatient Progress Note  11/17/23 Stephanie Golden  MRN:  [id number]  Assessment:  Stephanie Golden [facility] presents for follow-up evaluation in-person. In the prior visit,, patient reports overall psychiatric stability upon switch to Abilify  however reports she did not increase to 10 mg as previously planned due to likely akathisia so initiation of propranolol  at that time while maintaining Abilify  at current dosing. In the interim, patient was hospitalized in 10/2023 at [facility] due to disorganized speech, agitation, and overall psychiatric decompensation.  Today, patient appears to be doing well.  She shows insight regarding the importance of taking her medications and showing good judgment as she is motivated to continue being consistent with her medications.  She attributes the recent psychiatric hospitalization 1 month ago to stopping her Abilify  prior to that due to wanting to see if she is able to be off medications.  Reassuringly, she restarted back on her Abilify  and feels like her symptoms of psychosis have greatly improved and has tolerated being on 10 mg of Abilify  with no akathisia as this was previously a concern in the past.  Patient also seems to have a good support system with her boyfriend.  Discussed that we can keep as needed propranolol  in her regimen in case she does notice restlessness.  We will maintain her Abilify  dose at this time and will follow-up in 6 weeks.   Of note, patient's BP was elevated in today's visit. Patient denies symptoms of chest pain, SOB, and blurry vision.  I placed a referral for PCP for patient to get elevated blood pressure evaluated as her blood pressure has been persistently elevated in most recent readings.  Identifying Information: Stephanie Golden is a 38 y.o. female with a history of schizophrenia who is an established patient with [facility] for management of symptoms of psychosis. During periods of decompensation, patient  demonstrates bizarre and disorganized behaviors and thought process, hyperreligiosity, and AVH.   Plan:  # Schizophrenia, r/o schizoaffective disorder Past medication trials: Lybalvi  (weight gain, sedation) -- Continue Abilify  10 mg daily   # Akathisia Past medication trials: none -- Continue Propranolol  10-20 mg daily PRN akathisia  -- Risks, benefits, and side effects including but not limited to hypotension, dizziness were reviewed with informed consent provided  # Weight gain on antipsychotic Past medication trials: none prior -- Have deferred metformin  for time being given prior noncompliance and transition to Abilify  -- Patient to continue lifestyle interventions including balanced diet and incorporating daily exercise  # Medication monitoring -- Hgb A1c 5.7 (prediabetes); lipid profile 11/16/21 revealing for elevated LDL -- Updated A1c and lipid panel ordered and pending  # Vitamin D  deficiency Vitamin D  9.7 05/10/23 -- Continue Vitamin D  50000 IU weekly (s5/20) with plan to transition to 2000 IU daily thereafter  # Elevated BP - Referral to PCP made   Patient was given contact information for behavioral health clinic and was instructed to call 911 for emergencies.   Subjective:  Chief Complaint: med management  Interval History:   Patient seen alone.  Patient reports feeling a lot better today. She states that she stopped her Abilify  because she wanted to process things and wanted to see if she was capable of being off medications. Moving forward, she states there may be more to the meds and it may be helping a bit more than what I believe. She states when she was off the medications, she was hearing voices telling her to harm herself. She states  the voices getting so loud that other people could hear it. She states she was in [facility] psych hospital for 4 days and she was put on a different medication. She states that she left beginning of October. When she returned home,  she went back to taking her Abilify . She states that after being discharged, she was not given instruction regarding medication regimen. She states taking Abilify  right away after being hospitalization. She reports taking one full tablet daily of Abilify  and feels better since getting back on her Abilify .   She states her 66 year old daughter lives with her. She states her boyfriend talks to her every day.   Regarding psychiatric symptoms, she denies AVH currently and states the most recent time having AH was before hospitalization. She states that she has not been taking propranolol  recently. She states previously took the propranolol  twice and she thinks that it helped with restlessness. Patient reports the following adverse effects: denies akathesia. Patient reports good sleep, reporting 8 hours nightly. Patient reports good appetite. Offered metformin  and declined due to previously experiencing nausea.  Discussed that she can inquire with her PCP regarding other weight loss medication options.  Patient denies current SI, HI. We discussed a safety plan including warning signs, coping strategies, and possible people to contact and resources to utilize if SI worsen.   Substance use: tobacco daily- 1 cigar  Occasional alcohol once every 2 month  Past Psychiatric History:  Diagnoses: schizophrenia vs. Schizoaffective disorder (first psychotic episode in Feb 2022) Medication trials: olanzapine , trazodone  Hospitalizations: presented to ED 09/13/20 under IVC with [facility]; psychiatric ED March 2025 for psychosis and bizarre behaviors, inpatient 10/2023 for 4 days due to psychosis Substance use:   -- Etoh: socially  -- Tobacco: Black and Milds cigars x2 daily  -- Denies illicit drug use   Family Psychiatric History:  Uncle (maternal): Schizophrenia, diagnosed in his early 30s as well. He was able to live alone for some time, unfortunately was not very compliant and is currently living in a group home  facility.   Social history: Living: with 90 year old daughter Relationship: in a relationship Support: boyfriend  Past Medical History:  Past Medical History:  Diagnosis Date   H/O candidiasis 02/22/2011   yeast   H/O rubella 02/22/2011   yeast   H/O varicella    Involuntary commitment    No pertinent past medical history    Schizophrenia (HCC)     Past Surgical History:  Procedure Laterality Date   CESAREAN SECTION  08/07/2011   Procedure: CESAREAN SECTION;  Surgeon: Rome Rigg, MD;  Location: [facility];  Service: Gynecology;  Laterality: N/A;  Primary cesarean section with delivery of baby girl at 77. Apgars 9/9.   stitches to r eye      Family History:  Family History  Problem Relation Age of Onset   Drug abuse Father    Diabetes Maternal Uncle    Hypertension Maternal Uncle    Cancer Paternal Aunt    Cancer Maternal Grandmother    Heart disease Paternal Grandmother    Hypertension Paternal Grandmother    Diabetes Paternal Grandmother    Hypertension Maternal Grandfather    Anesthesia problems Neg Hx    Hypotension Neg Hx    Malignant hyperthermia Neg Hx    Pseudochol deficiency Neg Hx     Social History:  Social History   Socioeconomic History   Marital status: Single    Spouse name: Not on file   Number of  children: Not on file   Years of education: Not on file   Highest education level: Not on file  Occupational History   Not on file  Tobacco Use   Smoking status: Every Day    Types: Cigars, Cigarettes   Smokeless tobacco: Never   Tobacco comments:    Smokes Black and Mild Cigars x2 daily  Substance and Sexual Activity   Alcohol use: No   Drug use: No   Sexual activity: Yes    Birth control/protection: None  Other Topics Concern   Not on file  Social History Narrative   Not on file   Social Drivers of Health   Financial Resource Strain: Not on file  Food Insecurity: Not on file  Transportation Needs: Not on file  Physical Activity:  Not on file  Stress: Not on file  Social Connections: Not on file    Allergies: No Known Allergies  Current Medications: Current Outpatient Medications  Medication Sig Dispense Refill   ARIPiprazole  (ABILIFY ) 10 MG tablet Take 1 tablet (10 mg total) by mouth daily. 60 tablet 0   propranolol  (INDERAL ) 10 MG tablet Take 1-2 tablets (10-20 mg total) by mouth daily as needed (restlessness). 60 tablet 0   No current facility-administered medications for this visit.    Objective:  Psychiatric Specialty Exam: General Appearance: appears at stated age, casually dressed and groomed   Behavior: pleasant and cooperative   Psychomotor Activity: no psychomotor agitation or retardation noted   Eye Contact: fair  Speech: normal amount, volume and fluency    Mood: euthymic  Affect: congruent, pleasant and interactive   Thought Process: linear, goal directed, no circumstantial or tangential thought process noted, no racing thoughts or flight of ideas  Descriptions of Associations: intact   Thought Content Hallucinations: denies AH, VH , does not appear responding to stimuli  Delusions: no paranoia, delusions of control, grandeur, ideas of reference, thought broadcasting, and magical thinking  Suicidal Thoughts: denies SI, intention, plan  Homicidal Thoughts: denies HI, intention, plan   Alertness/Orientation: alert and fully oriented   Insight: fair Judgment: fair  Memory: intact   Executive Functions  Concentration: intact  Attention Span: fair  Recall: intact  Fund of Knowledge: fair   Physical Exam  General: Pleasant, well-appearing. No acute distress. Pulmonary: Normal effort. No wheezing or rales. Skin: No obvious rash or lesions. Neuro: A&Ox3.No focal deficit.  Review of Systems  No reported symptoms  Metabolic Disorder Labs: Lab Results  Component Value Date   HGBA1C 5.7 (H) 11/16/2021   No results found for: PROLACTIN Lab Results  Component Value Date    CHOL 184 11/16/2021   TRIG 85 11/16/2021   HDL 44 11/16/2021   CHOLHDL 4.2 11/16/2021   LDLCALC 124 (H) 11/16/2021   Lab Results  Component Value Date   TSH 1.873 10/20/2023   TSH 1.570 11/16/2021   Wt Readings from Last 3 Encounters:  11/17/23 177 lb 6.4 oz (80.5 kg)  10/20/23 171 lb 15.3 oz (78 kg)  05/30/23 172 lb 1.6 oz (78.1 kg)   Therapeutic Level Labs: No results found for: LITHIUM No results found for: VALPROATE No results found for: CBMZ  Screenings: GAD-7    Flowsheet Row Office Visit from 09/03/2021 in [facility] Office Visit from 07/02/2021 in [facility] Office Visit from 04/30/2021 in [facility] Office Visit from 03/03/2021 in [facility] Clinical Support from 12/30/2020 in [facility]  Total GAD-7 Score 9 5 6 6 10    PHQ2-9    Flowsheet Row Office Visit from 09/03/2021 in [facility] Office Visit from 07/02/2021 in [facility] Office Visit from 04/30/2021 in [facility] Office Visit from 03/03/2021 in [facility] Clinical Support from 12/30/2020 in [facility]  PHQ-2 Total Score 5 2 2 2 2   PHQ-9 Total Score 13 4 8 6 10    Flowsheet Row ED from 10/20/2023 in [facility] Emergency Department at [facility] UC from 03/06/2022 in [facility] Urgent Care at [facility] Visit from 09/03/2021 in [facility]  C-SSRS RISK CATEGORY No Risk No Risk No Risk    Ismael Franco, MD PGY-3 Psychiatry Resident

## 2023-11-17 ENCOUNTER — Other Ambulatory Visit (HOSPITAL_COMMUNITY): Payer: Self-pay

## 2023-11-17 ENCOUNTER — Other Ambulatory Visit: Payer: Self-pay

## 2023-11-17 ENCOUNTER — Ambulatory Visit (HOSPITAL_COMMUNITY): Admitting: Psychiatry

## 2023-11-17 VITALS — BP 155/101 | Wt 177.4 lb

## 2023-11-17 DIAGNOSIS — Z5181 Encounter for therapeutic drug level monitoring: Secondary | ICD-10-CM | POA: Diagnosis not present

## 2023-11-17 DIAGNOSIS — I10 Essential (primary) hypertension: Secondary | ICD-10-CM

## 2023-11-17 DIAGNOSIS — Z79899 Other long term (current) drug therapy: Secondary | ICD-10-CM

## 2023-11-17 DIAGNOSIS — F200 Paranoid schizophrenia: Secondary | ICD-10-CM

## 2023-11-17 MED ORDER — ARIPIPRAZOLE 10 MG PO TABS
10.0000 mg | ORAL_TABLET | Freq: Every day | ORAL | 0 refills | Status: AC
  Filled 2023-11-17: qty 60, 60d supply, fill #0
  Filled 2023-11-17: qty 30, 30d supply, fill #0

## 2023-11-17 MED ORDER — PROPRANOLOL HCL 10 MG PO TABS
10.0000 mg | ORAL_TABLET | Freq: Every day | ORAL | 0 refills | Status: AC | PRN
  Filled 2023-11-17: qty 60, 30d supply, fill #0

## 2023-11-27 ENCOUNTER — Other Ambulatory Visit (INDEPENDENT_AMBULATORY_CARE_PROVIDER_SITE_OTHER)

## 2023-11-27 ENCOUNTER — Other Ambulatory Visit: Payer: Self-pay

## 2023-11-27 DIAGNOSIS — Z5181 Encounter for therapeutic drug level monitoring: Secondary | ICD-10-CM

## 2023-11-27 DIAGNOSIS — Z79899 Other long term (current) drug therapy: Secondary | ICD-10-CM

## 2023-11-27 NOTE — Progress Notes (Signed)
 Patient presented to the office for labs, labs were drawn from [facility] with no issue or complaints . Pt left office alert and ambulatory.

## 2023-11-28 LAB — COMPREHENSIVE METABOLIC PANEL WITH GFR

## 2023-11-28 LAB — CBC WITH DIFFERENTIAL/PLATELET
Basophils Absolute: 0 x10E3/uL (ref 0.0–0.2)
Basos: 1 %
EOS (ABSOLUTE): 0.1 x10E3/uL (ref 0.0–0.4)
Eos: 1 %
Hematocrit: 44.4 % (ref 34.0–46.6)
Hemoglobin: 14.6 g/dL (ref 11.1–15.9)
Immature Grans (Abs): 0 x10E3/uL (ref 0.0–0.1)
Immature Granulocytes: 0 %
Lymphocytes Absolute: 1.7 x10E3/uL (ref 0.7–3.1)
Lymphs: 25 %
MCH: 30.2 pg (ref 26.6–33.0)
MCHC: 32.9 g/dL (ref 31.5–35.7)
MCV: 92 fL (ref 79–97)
Monocytes Absolute: 0.5 x10E3/uL (ref 0.1–0.9)
Monocytes: 7 %
Neutrophils Absolute: 4.7 x10E3/uL (ref 1.4–7.0)
Neutrophils: 66 %
Platelets: 224 x10E3/uL (ref 150–450)
RBC: 4.83 x10E6/uL (ref 3.77–5.28)
RDW: 12.7 % (ref 11.7–15.4)
WBC: 7 x10E3/uL (ref 3.4–10.8)

## 2023-11-28 LAB — LIPID PANEL

## 2023-11-28 LAB — HEMOGLOBIN A1C
Est. average glucose Bld gHb Est-mCnc: 111 mg/dL
Hgb A1c MFr Bld: 5.5 % (ref 4.8–5.6)

## 2023-12-01 LAB — LIPID PANEL

## 2023-12-18 NOTE — Progress Notes (Unsigned)
 [facility] MD Outpatient Progress Note  12/18/23 Stephanie Golden  MRN:  [id number]  Assessment:  Stephanie [facility] presents for follow-up evaluation in-person. In the prior visit, discussed that we can keep as needed propranolol  in her regimen in case she does notice restlessness and maintained her Abilify  dose. Today, ***   Identifying Information: Stephanie Golden is a 38 y.o. female with a history of schizophrenia who is an established patient with [facility] for management of symptoms of psychosis. During periods of decompensation, patient demonstrates bizarre and disorganized behaviors and thought process, hyperreligiosity, and AVH.   Plan:  # Schizophrenia, r/o schizoaffective disorder Past medication trials: Lybalvi  (weight gain, sedation) -- Continue Abilify  10 mg daily   -- needing updated lipid panel ***  # Akathisia Past medication trials: none -- Continue Propranolol  10-20 mg daily PRN akathisia  -- Risks, benefits, and side effects including but not limited to hypotension, dizziness were reviewed with informed consent provided  # Weight gain on antipsychotic Past medication trials: none prior -- Have deferred metformin  for time being given prior noncompliance and transition to Abilify  -- Patient to continue lifestyle interventions including balanced diet and incorporating daily exercise  # Vitamin D  deficiency Vitamin D  9.7 05/10/23 -- Vitamin D  50000 IU weekly (s5/20) with plan to transition to 2000 IU daily thereafter  Patient was given contact information for behavioral health clinic and was instructed to call 911 for emergencies.   Subjective:  Chief Complaint: med management  Interval History:   Patient seen ***.  Patient reports feeling *** today. Since the previous visit, ***. Stressors include ***.   Regarding psychiatric symptoms, ***. Patient reports the medications are ***. Patient reports the following adverse effects: ***.   Patient  reports *** sleep, ***. Patient reports *** appetite, ***.   Patient denies current SI, HI, and AVH. ***  Substance use: *** tobacco daily- 1 cigar  Occasional alcohol once every 2 month  Past Psychiatric History:  Diagnoses: schizophrenia vs. Schizoaffective disorder (first psychotic episode in Feb 2022) Medication trials: olanzapine , trazodone  Hospitalizations: presented to ED 09/13/20 under IVC with [facility]; psychiatric ED March 2025 for psychosis and bizarre behaviors, inpatient 10/2023 for 4 days due to psychosis Substance use:   -- Etoh: socially  -- Tobacco: Black and Milds cigars x2 daily  -- Denies illicit drug use   Family Psychiatric History:  Uncle (maternal): Schizophrenia, diagnosed in his early 30s as well. He was able to live alone for some time, unfortunately was not very compliant and is currently living in a group home facility.   Social history: Living: with 46 year old daughter Relationship: in a relationship Support: boyfriend  Past Medical History:  Past Medical History:  Diagnosis Date   H/O candidiasis 02/22/2011   yeast   H/O rubella 02/22/2011   yeast   H/O varicella    Involuntary commitment    No pertinent past medical history    Schizophrenia (HCC)     Past Surgical History:  Procedure Laterality Date   CESAREAN SECTION  08/07/2011   Procedure: CESAREAN SECTION;  Surgeon: Rome Rigg, MD;  Location: [facility];  Service: Gynecology;  Laterality: N/A;  Primary cesarean section with delivery of baby girl at 75. Apgars 9/9.   stitches to r eye      Family History:  Family History  Problem Relation Age of Onset   Drug abuse Father    Diabetes Maternal Uncle    Hypertension Maternal Uncle    Cancer  Paternal Aunt    Cancer Maternal Grandmother    Heart disease Paternal Grandmother    Hypertension Paternal Grandmother    Diabetes Paternal Grandmother    Hypertension Maternal Grandfather    Anesthesia problems Neg Hx    Hypotension Neg Hx     Malignant hyperthermia Neg Hx    Pseudochol deficiency Neg Hx     Social History:  Social History   Socioeconomic History   Marital status: Single    Spouse name: Not on file   Number of children: Not on file   Years of education: Not on file   Highest education level: Not on file  Occupational History   Not on file  Tobacco Use   Smoking status: Every Day    Types: Cigars, Cigarettes   Smokeless tobacco: Never   Tobacco comments:    Smokes Black and Mild Cigars x2 daily  Substance and Sexual Activity   Alcohol use: No   Drug use: No   Sexual activity: Yes    Birth control/protection: None  Other Topics Concern   Not on file  Social History Narrative   Not on file   Social Drivers of Health   Financial Resource Strain: Not on file  Food Insecurity: Not on file  Transportation Needs: Not on file  Physical Activity: Not on file  Stress: Not on file  Social Connections: Not on file    Allergies: No Known Allergies  Current Medications: Current Outpatient Medications  Medication Sig Dispense Refill   ARIPiprazole  (ABILIFY ) 10 MG tablet Take 1 tablet (10 mg total) by mouth daily. 60 tablet 0   propranolol  (INDERAL ) 10 MG tablet Take 1-2 tablets (10-20 mg total) by mouth daily as needed (restlessness). 60 tablet 0   No current facility-administered medications for this visit.    Objective:  Psychiatric Specialty Exam: General Appearance: appears at stated age, casually dressed and groomed ***  Behavior: pleasant and cooperative ***  Psychomotor Activity: no psychomotor agitation or retardation noted ***  Eye Contact: fair *** Speech: normal amount, volume and fluency ***   Mood: euthymic *** Affect: congruent, pleasant and interactive ***  Thought Process: linear, goal directed, no circumstantial or tangential thought process noted, no racing thoughts or flight of ideas *** Descriptions of Associations: intact ***  Thought Content Hallucinations:  denies AH, VH , does not appear responding to stimuli *** Delusions: no paranoia, delusions of control, grandeur, ideas of reference, thought broadcasting, and magical thinking *** Suicidal Thoughts: denies SI, intention, plan *** Homicidal Thoughts: denies HI, intention, plan ***  Alertness/Orientation: alert and fully oriented ***  Insight: fair*** Judgment: fair***  Memory: intact ***  Executive Functions  Concentration: intact *** Attention Span: fair *** Recall: intact *** Fund of Knowledge: fair ***  Physical Exam *** General: Pleasant, well-appearing. No acute distress. Pulmonary: Normal effort. No wheezing or rales. Skin: No obvious rash or lesions.*** Neuro: A&Ox3.No focal deficit.  Review of Systems *** No reported symptoms   Metabolic Disorder Labs: Lab Results  Component Value Date   HGBA1C 5.5 11/27/2023   No results found for: PROLACTIN Lab Results  Component Value Date   CHOL CANCELED 11/27/2023   CHOL CANCELED 11/27/2023   TRIG CANCELED 11/27/2023   TRIG CANCELED 11/27/2023   HDL CANCELED 11/27/2023   HDL CANCELED 11/27/2023   CHOLHDL 4.2 11/16/2021   LDLCALC 124 (H) 11/16/2021   Lab Results  Component Value Date   TSH 1.873 10/20/2023   TSH 1.570 11/16/2021   Wt Readings  from Last 3 Encounters:  11/17/23 177 lb 6.4 oz (80.5 kg)  10/20/23 171 lb 15.3 oz (78 kg)  05/30/23 172 lb 1.6 oz (78.1 kg)   Therapeutic Level Labs: No results found for: LITHIUM No results found for: VALPROATE No results found for: CBMZ  Screenings: GAD-7    Flowsheet Row Office Visit from 09/03/2021 in [facility] Office Visit from 07/02/2021 in [facility] Office Visit from 04/30/2021 in [facility] Office Visit from 03/03/2021 in [facility] Clinical Support from 12/30/2020 in [facility]  Total GAD-7 Score 9 5 6 6 10     PHQ2-9    Flowsheet Row Office Visit from 09/03/2021 in [facility] Office Visit from 07/02/2021 in [facility] Office Visit from 04/30/2021 in [facility] Office Visit from 03/03/2021 in [facility] Clinical Support from 12/30/2020 in [facility]  PHQ-2 Total Score 5 2 2 2 2   PHQ-9 Total Score 13 4 8 6 10    Flowsheet Row ED from 10/20/2023 in [facility] at [facility] UC from 03/06/2022 in [facility] Visit from 09/03/2021 in [facility]  C-SSRS RISK CATEGORY No Risk No Risk No Risk    Ismael Franco, MD PGY-3 Psychiatry Resident

## 2023-12-29 ENCOUNTER — Ambulatory Visit (INDEPENDENT_AMBULATORY_CARE_PROVIDER_SITE_OTHER): Admitting: Psychiatry

## 2023-12-29 ENCOUNTER — Other Ambulatory Visit: Payer: Self-pay

## 2023-12-29 VITALS — BP 152/97 | Wt 172.0 lb

## 2023-12-29 DIAGNOSIS — I10 Essential (primary) hypertension: Secondary | ICD-10-CM

## 2023-12-29 DIAGNOSIS — E559 Vitamin D deficiency, unspecified: Secondary | ICD-10-CM | POA: Diagnosis not present

## 2023-12-29 DIAGNOSIS — F200 Paranoid schizophrenia: Secondary | ICD-10-CM | POA: Diagnosis not present

## 2023-12-29 MED ORDER — ARIPIPRAZOLE 10 MG PO TABS
10.0000 mg | ORAL_TABLET | Freq: Every day | ORAL | 0 refills | Status: AC
  Filled 2023-12-29: qty 90, 90d supply, fill #0

## 2024-03-22 ENCOUNTER — Encounter (HOSPITAL_COMMUNITY): Admitting: Psychiatry
